# Patient Record
Sex: Female | Born: 1950 | Race: White | Hispanic: No | Smoking: Never smoker
Health system: Southern US, Community
[De-identification: ages and names within clinical notes are randomized; demographics above are authoritative.]

## PROBLEM LIST (undated history)

## (undated) DIAGNOSIS — E119 Type 2 diabetes mellitus without complications: Secondary | ICD-10-CM

## (undated) DIAGNOSIS — M797 Fibromyalgia: Secondary | ICD-10-CM

## (undated) DIAGNOSIS — E7800 Pure hypercholesterolemia, unspecified: Secondary | ICD-10-CM

## (undated) DIAGNOSIS — C801 Malignant (primary) neoplasm, unspecified: Secondary | ICD-10-CM

## (undated) DIAGNOSIS — K219 Gastro-esophageal reflux disease without esophagitis: Secondary | ICD-10-CM

## (undated) DIAGNOSIS — C20 Malignant neoplasm of rectum: Secondary | ICD-10-CM

## (undated) DIAGNOSIS — J45909 Unspecified asthma, uncomplicated: Secondary | ICD-10-CM

## (undated) DIAGNOSIS — M1990 Unspecified osteoarthritis, unspecified site: Secondary | ICD-10-CM

## (undated) DIAGNOSIS — I10 Essential (primary) hypertension: Secondary | ICD-10-CM

## (undated) HISTORY — PX: BREAST CYST EXCISION: SHX579

## (undated) HISTORY — PX: ABDOMINAL HYSTERECTOMY: SHX81

## (undated) HISTORY — PX: INSERTION OF MESH: SHX5868

---

## 2001-08-10 DIAGNOSIS — C20 Malignant neoplasm of rectum: Secondary | ICD-10-CM

## 2001-08-10 HISTORY — DX: Malignant neoplasm of rectum: C20

## 2011-08-11 HISTORY — PX: COLON RESECTION: SHX5231

## 2013-09-01 ENCOUNTER — Emergency Department (INDEPENDENT_AMBULATORY_CARE_PROVIDER_SITE_OTHER)
Admission: EM | Admit: 2013-09-01 | Discharge: 2013-09-01 | Disposition: A | Discharge status: Home / Self Care | Payer: Medicare Other | Source: Home / Self Care | Attending: Family Medicine | Admitting: Family Medicine

## 2013-09-01 ENCOUNTER — Encounter (HOSPITAL_COMMUNITY): Payer: Self-pay | Admitting: Emergency Medicine

## 2013-09-01 ENCOUNTER — Emergency Department (INDEPENDENT_AMBULATORY_CARE_PROVIDER_SITE_OTHER): Payer: Medicare Other

## 2013-09-01 DIAGNOSIS — J329 Chronic sinusitis, unspecified: Secondary | ICD-10-CM

## 2013-09-01 HISTORY — DX: Type 2 diabetes mellitus without complications: E11.9

## 2013-09-01 HISTORY — DX: Malignant (primary) neoplasm, unspecified: C80.1

## 2013-09-01 HISTORY — DX: Pure hypercholesterolemia, unspecified: E78.00

## 2013-09-01 HISTORY — DX: Unspecified asthma, uncomplicated: J45.909

## 2013-09-01 HISTORY — DX: Essential (primary) hypertension: I10

## 2013-09-01 LAB — POCT I-STAT, CHEM 8
BUN: 14 mg/dL (ref 6–23)
CREATININE: 0.6 mg/dL (ref 0.50–1.10)
Calcium, Ion: 1.28 mmol/L (ref 1.13–1.30)
Chloride: 103 mEq/L (ref 96–112)
Glucose, Bld: 124 mg/dL — ABNORMAL HIGH (ref 70–99)
HEMATOCRIT: 35 % — AB (ref 36.0–46.0)
HEMOGLOBIN: 11.9 g/dL — AB (ref 12.0–15.0)
POTASSIUM: 4 meq/L (ref 3.7–5.3)
Sodium: 141 mEq/L (ref 137–147)
TCO2: 27 mmol/L (ref 0–100)

## 2013-09-01 LAB — CBC WITH DIFFERENTIAL/PLATELET
BASOS ABS: 0 10*3/uL (ref 0.0–0.1)
BASOS PCT: 0 % (ref 0–1)
EOS ABS: 0.1 10*3/uL (ref 0.0–0.7)
EOS PCT: 2 % (ref 0–5)
HCT: 37.9 % (ref 36.0–46.0)
Hemoglobin: 12.7 g/dL (ref 12.0–15.0)
Lymphocytes Relative: 19 % (ref 12–46)
Lymphs Abs: 1.2 10*3/uL (ref 0.7–4.0)
MCH: 29.1 pg (ref 26.0–34.0)
MCHC: 33.5 g/dL (ref 30.0–36.0)
MCV: 86.9 fL (ref 78.0–100.0)
Monocytes Absolute: 0.7 10*3/uL (ref 0.1–1.0)
Monocytes Relative: 11 % (ref 3–12)
NEUTROS PCT: 68 % (ref 43–77)
Neutro Abs: 4.4 10*3/uL (ref 1.7–7.7)
PLATELETS: 237 10*3/uL (ref 150–400)
RBC: 4.36 MIL/uL (ref 3.87–5.11)
RDW: 13.7 % (ref 11.5–15.5)
WBC: 6.4 10*3/uL (ref 4.0–10.5)

## 2013-09-01 MED ORDER — METHYLPREDNISOLONE (PAK) 4 MG PO TABS: ORAL_TABLET | ORAL | Status: DC

## 2013-09-01 MED ORDER — MINOCYCLINE HCL 100 MG PO CAPS: 100.0000 mg | ORAL_CAPSULE | Freq: Two times a day (BID) | ORAL | Status: DC

## 2013-09-01 MED ORDER — FLUTICASONE PROPIONATE 50 MCG/ACT NA SUSP: 2.0000 | Freq: Two times a day (BID) | NASAL | Status: DC

## 2013-09-01 NOTE — ED Notes (Signed)
C/o  Sinus pressure and pain.  Post nasal drip.  Productive cough with yellow sputum.  Chest congestion.  States felt feverish at night.  Pt has not tried any otc meds for symptoms.   Symptoms present x 4 days.  Denies n/v/d

## 2013-09-01 NOTE — ED Provider Notes (Signed)
Medical screening examination/treatment/procedure(s) were performed by resident physician or non-physician practitioner and as supervising physician I was immediately available for consultation/collaboration.   Marzell Isakson DOUGLAS MD.   Alger Kerstein D Edye Hainline, MD 09/01/13 1313 

## 2013-09-01 NOTE — ED Notes (Signed)
Waiting on EKG before x-ray

## 2013-09-01 NOTE — ED Provider Notes (Signed)
CSN: [id number]     Arrival date & time 09/01/13  4098 History   First MD Initiated Contact with Patient 09/01/13 1010     Chief Complaint  Patient presents with  . URI   (Consider location/radiation/quality/duration/timing/severity/associated sxs/prior Treatment) HPI Comments: 63 year old female presents complaining of sinus pressure and pain, postnasal drip, productive cough, chest congestion, subjective fevers, hearing loss, loss of smell, wheezing, body aches. This has all been present for the past 4 days, getting neither better nor worse. She has not tried taking any over-the-counter medications. She has multiple sick contacts. She denies any other symptoms. She also admits to a very short episode of chest pain a couple days ago, this resolved quickly without any intervention and did not have any associated symptoms.  Patient is a 63 y.o. female presenting with URI.  URI Presenting symptoms: congestion, cough, fatigue, fever, rhinorrhea and sore throat   Associated symptoms: arthralgias, headaches, myalgias and wheezing (Hx asthma )     Past Medical History  Diagnosis Date  . Asthma   . Hypertension   . Diabetes mellitus without complication   . Elevated cholesterol   . Cancer    History reviewed. No pertinent past surgical history. No family history on file. History  Substance Use Topics  . Smoking status: Never Smoker   . Smokeless tobacco: Not on file  . Alcohol Use: No   OB History   Grav Para Term Preterm Abortions TAB SAB Ect Mult Living                 Review of Systems  Constitutional: Positive for fever, chills and fatigue.  HENT: Positive for congestion, hearing loss, rhinorrhea, sinus pressure and sore throat.        Decreased sense of smell  Eyes: Negative for visual disturbance.  Respiratory: Positive for cough, chest tightness, shortness of breath (mild, due to congestion) and wheezing (Hx asthma ).   Cardiovascular: Positive for chest pain. Negative for  palpitations and leg swelling.  Gastrointestinal: Negative for nausea, vomiting, abdominal pain and diarrhea.  Endocrine: Negative for polydipsia and polyuria.  Genitourinary: Negative for dysuria, urgency and frequency.  Musculoskeletal: Positive for arthralgias and myalgias.       Hx fibromyalgia   Skin: Negative for rash.  Neurological: Positive for headaches. Negative for dizziness, weakness and light-headedness.    Allergies  Review of patient's allergies indicates no known allergies.  Home Medications   Current Outpatient Rx  Name  Route  Sig  Dispense  Refill  . lisinopril-hydrochlorothiazide (PRINZIDE,ZESTORETIC) 20-25 MG per tablet   Oral   Take 1 tablet by mouth daily.         . pravastatin (PRAVACHOL) 20 MG tablet   Oral   Take 20 mg by mouth daily.         . Ranitidine HCl (ZANTAC PO)   Oral   Take by mouth.         . fluticasone (FLONASE) 50 MCG/ACT nasal spray   Each Nare   Place 2 sprays into both nostrils 2 (two) times daily.   1 g   2   . methylPREDNIsolone (MEDROL DOSPACK) 4 MG tablet      follow package directions   21 tablet   0   . minocycline (MINOCIN) 100 MG capsule   Oral   Take 1 capsule (100 mg total) by mouth 2 (two) times daily.   20 capsule   0    BP 136/83  Pulse 85  Temp(Src)  98.5 F (36.9 C) (Oral)  Resp 18  SpO2 98% Physical Exam  Nursing note and vitals reviewed. Constitutional: She is oriented to person, place, and time. Vital signs are normal. She appears well-developed and well-nourished. No distress.  HENT:  Head: Normocephalic and atraumatic.  Right Ear: There is drainage (Cerumen impaction).  Left Ear: Tympanic membrane, external ear and ear canal normal.  Nose: Nose normal. Right sinus exhibits no maxillary sinus tenderness and no frontal sinus tenderness. Left sinus exhibits no maxillary sinus tenderness and no frontal sinus tenderness.  Mouth/Throat: Oropharynx is clear and moist. No oropharyngeal exudate.   Eyes: Conjunctivae are normal. Right eye exhibits no discharge. Left eye exhibits no discharge.  Neck: Normal range of motion.  Cardiovascular: Normal rate, regular rhythm and normal heart sounds.  Exam reveals no gallop and no friction rub.   No murmur heard. Pulmonary/Chest: Effort normal and breath sounds normal. No respiratory distress. She has no wheezes. She has no rales.  Lymphadenopathy:    She has no cervical adenopathy.  Neurological: She is alert and oriented to person, place, and time. She has normal strength. Coordination normal.  Skin: Skin is warm and dry. No rash noted. She is not diaphoretic.  Psychiatric: She has a normal mood and affect. Judgment normal.    ED Course  Procedures (including critical care time) Labs Review Labs Reviewed  POCT I-STAT, CHEM 8 - Abnormal; Notable for the following:    Glucose, Bld 124 (*)    Hemoglobin 11.9 (*)    HCT 35.0 (*)    All other components within normal limits  CBC WITH DIFFERENTIAL   Imaging Review Dg Chest 2 View  09/01/2013   CLINICAL DATA:  Upper respiratory infection. A congestion and pain in chest.  EXAM: CHEST  2 VIEW  COMPARISON:  None.  FINDINGS: Cardiac silhouette is upper limits of normal in size. Aorta is mildly tortuous. The lungs are well inflated without evidence of focal airspace consolidation, edema, pleural effusion, or pneumothorax. No acute osseous abnormality is seen.  IMPRESSION: No evidence of acute airspace disease.   Electronically Signed   By: Logan Bores   On: 09/01/2013 11:20     EKG is normal. I-STAT and CBC are both normal as well. Chest x-ray is also normal  MDM   1. Sinusitis    Followup if not improving. May take over-the-counter medications as needed addition to the prescriptions  Meds ordered this encounter  Medications  . minocycline (MINOCIN) 100 MG capsule    Sig: Take 1 capsule (100 mg total) by mouth 2 (two) times daily.    Dispense:  20 capsule    Refill:  0    Order  Specific Question:  Supervising Provider    Answer:  Billy Fischer [phone]  . methylPREDNIsolone (MEDROL DOSPACK) 4 MG tablet    Sig: follow package directions    Dispense:  21 tablet    Refill:  0    Order Specific Question:  Supervising Provider    Answer:  Billy Fischer [phone]  . fluticasone (FLONASE) 50 MCG/ACT nasal spray    Sig: Place 2 sprays into both nostrils 2 (two) times daily.    Dispense:  1 g    Refill:  2    Order Specific Question:  Supervising Provider    Answer:  Ihor Gully D [place] Ayo Guarino, PA-C 09/01/13 1141

## 2013-09-01 NOTE — Discharge Instructions (Signed)

## 2013-12-23 ENCOUNTER — Encounter (HOSPITAL_COMMUNITY): Payer: Self-pay | Admitting: *Deleted

## 2013-12-23 ENCOUNTER — Inpatient Hospital Stay (HOSPITAL_COMMUNITY)
Admission: AD | Admit: 2013-12-23 | Discharge: 2013-12-24 | Disposition: A | Discharge status: Home / Self Care | Payer: Medicare Other | Source: Ambulatory Visit | Attending: Obstetrics & Gynecology | Admitting: Obstetrics & Gynecology

## 2013-12-23 DIAGNOSIS — N644 Mastodynia: Secondary | ICD-10-CM | POA: Insufficient documentation

## 2013-12-23 DIAGNOSIS — E119 Type 2 diabetes mellitus without complications: Secondary | ICD-10-CM | POA: Insufficient documentation

## 2013-12-23 DIAGNOSIS — I10 Essential (primary) hypertension: Secondary | ICD-10-CM | POA: Insufficient documentation

## 2013-12-23 DIAGNOSIS — N63 Unspecified lump in unspecified breast: Secondary | ICD-10-CM | POA: Insufficient documentation

## 2013-12-23 DIAGNOSIS — N6310 Unspecified lump in the right breast, unspecified quadrant: Secondary | ICD-10-CM

## 2013-12-23 DIAGNOSIS — Z85048 Personal history of other malignant neoplasm of rectum, rectosigmoid junction, and anus: Secondary | ICD-10-CM | POA: Insufficient documentation

## 2013-12-23 HISTORY — DX: Malignant neoplasm of rectum: C20

## 2013-12-23 NOTE — MAU Note (Signed)
Patient reports soreness in the right breast around the nipple that started yesterday. Took Ibuprofen today because the pain was so bad. States she felt a hard area under the nipple today and got concerned. Last mammogram was in Sept- wnl. No hx of breast cancer.

## 2013-12-24 DIAGNOSIS — N644 Mastodynia: Secondary | ICD-10-CM

## 2013-12-24 NOTE — MAU Provider Note (Signed)
Chief Complaint: Breast Pain   First Provider Initiated Contact with Patient 12/24/13 0013     SUBJECTIVE HPI: [name] is a 63 y.o. G5P5000 who presents to maternity admissions reporting pain and lump in her right breast, underneath her nipple.  She reports she started to have pain yesterday and it has gotten worse today.  She had a normal screening mammogram 8 months ago.  She recently moved to [place] and established care with a PCP but developed this problem after her visit.  She has hx significant for rectal cancer so is worried about breast cancer today. She denies abdominal pain, vaginal bleeding, vaginal itching/burning, urinary symptoms, h/a, dizziness, n/v, or fever/chills.     Past Medical History  Diagnosis Date  . Asthma   . Hypertension   . Diabetes mellitus without complication   . Elevated cholesterol   . Cancer   . Rectal cancer Oct 2013   Past Surgical History  Procedure Laterality Date  . Colon resection  2013  . Abdominal hysterectomy    . Breast cyst excision    . Insertion of mesh      Abdominal mesh placed after chemotherapy   History   Social History  . Marital Status: Widowed    Spouse Name: N/A    Number of Children: N/A  . Years of Education: N/A   Occupational History  . Not on file.   Social History Main Topics  . Smoking status: Never Smoker   . Smokeless tobacco: Not on file  . Alcohol Use: No  . Drug Use: No  . Sexual Activity: Not Currently   Other Topics Concern  . Not on file   Social History Narrative  . No narrative on file   No current facility-administered medications on file prior to encounter.   Current Outpatient Prescriptions on File Prior to Encounter  Medication Sig Dispense Refill  . lisinopril-hydrochlorothiazide (PRINZIDE,ZESTORETIC) 20-25 MG per tablet Take 1 tablet by mouth daily.      . pravastatin (PRAVACHOL) 20 MG tablet Take 20 mg by mouth daily.      . fluticasone (FLONASE) 50 MCG/ACT nasal spray Place  2 sprays into both nostrils 2 (two) times daily.  1 g  2  . methylPREDNIsolone (MEDROL DOSPACK) 4 MG tablet follow package directions  21 tablet  0  . minocycline (MINOCIN) 100 MG capsule Take 1 capsule (100 mg total) by mouth 2 (two) times daily.  20 capsule  0  . Ranitidine HCl (ZANTAC PO) Take by mouth.       No Known Allergies  ROS: Pertinent items in HPI  OBJECTIVE Blood pressure 142/82, pulse 85, temperature 98.4 F (36.9 C), temperature source Oral, resp. rate 20, height 5' 3.5" (1.613 m), weight 92.534 kg (204 lb), SpO2 97.00%. GENERAL: Well-developed, well-nourished female in no acute distress.  HEENT: Normocephalic HEART: normal rate RESP: normal effort Physical Examination: Breasts - left breast normal without mass, skin or nipple changes or axillary nodes, abnormal mass palpable directly under right nipple, 3-4 cm in size, soft, mobile, smooth, mild erythema surrounding right nipple extending past areola ABDOMEN: Soft, non-tender EXTREMITIES: Nontender, no edema NEURO: Alert and oriented   ASSESSMENT 1. Breast pain, right   2. Breast mass, right     PLAN Discharge home Breast U/S ordered at [facility] Reassurance provided that exam consistent with cyst at this time  Return to MAU as needed for emergencies    Medication List         fluticasone  50 MCG/ACT nasal spray  Commonly known as:  FLONASE  Place 2 sprays into both nostrils 2 (two) times daily.     ibuprofen 800 MG tablet  Commonly known as:  ADVIL,MOTRIN  Take 800 mg by mouth every 8 (eight) hours as needed.     lisinopril-hydrochlorothiazide 20-25 MG per tablet  Commonly known as:  PRINZIDE,ZESTORETIC  Take 1 tablet by mouth daily.     methylPREDNIsolone 4 MG tablet  Commonly known as:  MEDROL DOSPACK  follow package directions     minocycline 100 MG capsule  Commonly known as:  MINOCIN  Take 1 capsule (100 mg total) by mouth 2 (two) times daily.     pravastatin 20 MG tablet  Commonly  known as:  PRAVACHOL  Take 20 mg by mouth daily.     ZANTAC PO  Take by mouth.           Follow-up Information   Follow up with [facility]. ([facility] will call you or call the number listed below. )    Specialty:  Diagnostic Radiology   Contact information:   [place] [address] [place] Certified Nurse-Midwife 12/24/2013  12:19 AM

## 2013-12-24 NOTE — Discharge Instructions (Signed)
Breast Cyst  A breast cyst is a sac in the breast that is filled with fluid. Breast cysts are common in women. Women can have one or many cysts. When the breasts contain many cysts, it is usually due to a noncancerous (benign) condition called fibrocystic change. These lumps form under the influence of female hormones (estrogen and progesterone). The lumps are most often located in the upper, outer portion of the breast. They are often more swollen, painful, and tender before your period starts. They usually disappear after menopause, unless you are on hormone therapy.   There are several types of cysts:  · Macrocyst. This is a cyst that is about 2 in. (5.1 cm) in diameter.    · Microcyst. This is a tiny cyst that you cannot feel but can be seen with a mammogram or an ultrasound.    · Galactocele. This is a cyst containing milk that may develop if you suddenly stop breastfeeding.    · Sebaceous cyst of the skin. This type of cyst is not in the breast tissue itself.  Breast cysts do not increase your risk of breast cancer. However, they must be monitored closely because they can be cancerous.   CAUSES   It is not known exactly what causes a breast cyst to form. Possible causes include:   · An overgrowth of milk glands and connective tissue in the breast can block the milk glands, causing them to fill with fluid.    · Scar tissue in the breast from previous surgery may block the glands, causing a cyst.    RISK FACTORS  Estrogen may influence the development of a breast cyst.    SIGNS AND SYMPTOMS   · Feeling a smooth, round, soft lump (like a grape) in the breast that is easily moveable.    · Breast discomfort or pain.  · Increase in size of the lump before your menstrual period and decrease in its size after your menstrual period.    DIAGNOSIS   A cyst can be felt during a physical exam by your health care provider. A breast X-ray exam (mammogram) and ultrasonography will be done to confirm the diagnosis. Fluid may  be removed from the cyst with a needle (fine needle aspiration) to make sure the cyst is not cancerous.    TREATMENT   Treatment may not be necessary. Your health care provider may monitor the cyst to see if it goes away on its own. If treatment is needed, it may include:  · Hormone treatment.    · Needle aspiration. There is a chance of the cyst coming back after aspiration.    · Surgery to remove the whole cyst.    HOME CARE INSTRUCTIONS   · Keep all follow-up appointments with your health care provider.  · See your health care provider regularly:  · Get a yearly exam by your health care provider.  · Have a clinical breast exam by a health care provider every 1 3 years if you are 20 61 years of age. After age 40 years, you should have the exam every year.    · Get mammogram tests as directed by your health care provider.    · Understand the normal appearance and feel of your breasts and perform breast self-exams.    · Only take over-the-counter or prescription medicines as directed by your health care provider.    · Wear a supportive bra, especially when exercising.    · Avoid caffeine.    · Reduce your salt intake, especially before your menstrual period. Too much salt can cause fluid retention, breast   swelling, and discomfort.    SEEK MEDICAL CARE IF:   · You feel, or think you feel, a lump in your breast.    · You notice that both breasts look or feel different than usual.    · Your breast is still causing pain after your menstrual period is over.    · You need medicine for breast pain and swelling that occurs with your menstrual period.    SEEK IMMEDIATE MEDICAL CARE IF:   · You have severe pain, tenderness, redness, or warmth in your breast.    · You have nipple discharge or bleeding.    · Your breast lump becomes hard and painful.    · You find new lumps or bumps that were not there before.    · You feel lumps in your armpit (axilla).    · You notice dimpling or wrinkling of the breast or nipple.    · You  have a fever.    MAKE SURE YOU:  · Understand these instructions.  · Will watch your condition.  · Will get help right away if you are not doing well or get worse.  Document Released: 07/27/2005 Document Revised: 03/29/2013 Document Reviewed: 02/23/2013  [employer]® Patient Information ©2014 [employer].

## 2013-12-25 ENCOUNTER — Other Ambulatory Visit: Payer: Self-pay

## 2013-12-25 ENCOUNTER — Other Ambulatory Visit: Payer: Self-pay | Admitting: Advanced Practice Midwife

## 2013-12-25 ENCOUNTER — Other Ambulatory Visit: Payer: Self-pay | Admitting: Obstetrics & Gynecology

## 2013-12-25 ENCOUNTER — Other Ambulatory Visit (HOSPITAL_COMMUNITY): Payer: Self-pay | Admitting: Advanced Practice Midwife

## 2013-12-25 DIAGNOSIS — N644 Mastodynia: Secondary | ICD-10-CM

## 2013-12-25 DIAGNOSIS — N6310 Unspecified lump in the right breast, unspecified quadrant: Secondary | ICD-10-CM

## 2014-01-04 ENCOUNTER — Other Ambulatory Visit: Payer: Medicare Other

## 2014-01-08 ENCOUNTER — Other Ambulatory Visit (HOSPITAL_COMMUNITY): Payer: Self-pay | Admitting: Orthopaedic Surgery

## 2014-01-26 ENCOUNTER — Encounter (HOSPITAL_COMMUNITY): Payer: Self-pay

## 2014-01-26 NOTE — Pre-Procedure Instructions (Signed)
[name]  01/26/2014   Your procedure is scheduled on:  Friday, July 3rd  Report to [facility] Admitting at 0530 AM.  Call this number if you have problems the morning of surgery: [phone]   Remember:   Do not eat food or drink liquids after midnight.   Take these medicines the morning of surgery with A SIP OF WATER: zantac, vicodin if needed   Do not wear jewelry, make-up or nail polish.  Do not wear lotions, powders, or perfumes. You may wear deodorant.  Do not shave 48 hours prior to surgery. Men may shave face and neck.  Do not bring valuables to the hospital.  [facility] is not responsible  for any belongings or valuables.               Contacts, dentures or bridgework may not be worn into surgery.  Leave suitcase in the car. After surgery it may be brought to your room.  For patients admitted to the hospital, discharge time is determined by your treatment team.             Please read over the following fact sheets that you were given: Pain Booklet, Coughing and Deep Breathing, Blood Transfusion Information, MRSA Information and Surgical Site Infection Prevention [facility] - Preparing for Surgery  Before surgery, you can play an important role.  Because skin is not sterile, your skin needs to be as free of germs as possible.  You can reduce the number of germs on you skin by washing with CHG (chlorahexidine gluconate) soap before surgery.  CHG is an antiseptic cleaner which kills germs and bonds with the skin to continue killing germs even after washing.  Please DO NOT use if you have an allergy to CHG or antibacterial soaps.  If your skin becomes reddened/irritated stop using the CHG and inform your nurse when you arrive at Short Stay.  Do not shave (including legs and underarms) for at least 48 hours prior to the first CHG shower.  You may shave your face.  Please follow these instructions carefully:   1.  Shower with CHG Soap the night before surgery and  the morning of Surgery.  2.  If you choose to wash your hair, wash your hair first as usual with your normal shampoo.  3.  After you shampoo, rinse your hair and body thoroughly to remove the shampoo.  4.  Use CHG as you would any other liquid soap.  You can apply CHG directly to the skin and wash gently with scrungie or a clean washcloth.  5.  Apply the CHG Soap to your body ONLY FROM THE NECK DOWN.  Do not use on open wounds or open sores.  Avoid contact with your eyes, ears, mouth and genitals (private parts).  Wash genitals (private parts) with your normal soap.  6.  Wash thoroughly, paying special attention to the area where your surgery will be performed.  7.  Thoroughly rinse your body with warm water from the neck down.  8.  DO NOT shower/wash with your normal soap after using and rinsing off the CHG Soap.  9.  Pat yourself dry with a clean towel.            10.  Wear clean pajamas.            11.  Place clean sheets on your bed the night of your first shower and do not sleep with pets.  Day of Surgery  Do not apply any lotions/deoderants the morning of surgery.  Please wear clean clothes to the hospital/surgery center.

## 2014-01-29 ENCOUNTER — Encounter (HOSPITAL_COMMUNITY): Payer: Self-pay

## 2014-01-29 ENCOUNTER — Encounter (HOSPITAL_COMMUNITY)
Admission: RE | Admit: 2014-01-29 | Discharge: 2014-01-29 | Disposition: A | Discharge status: Home / Self Care | Payer: Medicare Other | Source: Ambulatory Visit | Attending: Orthopaedic Surgery | Admitting: Orthopaedic Surgery

## 2014-01-29 ENCOUNTER — Other Ambulatory Visit (HOSPITAL_COMMUNITY): Payer: Medicare Other

## 2014-01-29 DIAGNOSIS — Z01812 Encounter for preprocedural laboratory examination: Secondary | ICD-10-CM | POA: Insufficient documentation

## 2014-01-29 HISTORY — DX: Fibromyalgia: M79.7

## 2014-01-29 HISTORY — DX: Unspecified osteoarthritis, unspecified site: M19.90

## 2014-01-29 HISTORY — DX: Gastro-esophageal reflux disease without esophagitis: K21.9

## 2014-01-29 LAB — BASIC METABOLIC PANEL
BUN: 12 mg/dL (ref 6–23)
CALCIUM: 9.6 mg/dL (ref 8.4–10.5)
CO2: 28 mEq/L (ref 19–32)
CREATININE: 0.66 mg/dL (ref 0.50–1.10)
Chloride: 103 mEq/L (ref 96–112)
Glucose, Bld: 119 mg/dL — ABNORMAL HIGH (ref 70–99)
Potassium: 4.1 mEq/L (ref 3.7–5.3)
Sodium: 140 mEq/L (ref 137–147)

## 2014-01-29 LAB — SURGICAL PCR SCREEN
MRSA, PCR: NEGATIVE
STAPHYLOCOCCUS AUREUS: NEGATIVE

## 2014-01-29 LAB — CBC
HCT: 38.8 % (ref 36.0–46.0)
Hemoglobin: 12.4 g/dL (ref 12.0–15.0)
MCH: 28 pg (ref 26.0–34.0)
MCHC: 32 g/dL (ref 30.0–36.0)
MCV: 87.6 fL (ref 78.0–100.0)
PLATELETS: 250 10*3/uL (ref 150–400)
RBC: 4.43 MIL/uL (ref 3.87–5.11)
RDW: 13.9 % (ref 11.5–15.5)
WBC: 5.4 10*3/uL (ref 4.0–10.5)

## 2014-01-29 LAB — URINALYSIS, ROUTINE W REFLEX MICROSCOPIC
Bilirubin Urine: NEGATIVE
Glucose, UA: NEGATIVE mg/dL
Hgb urine dipstick: NEGATIVE
Ketones, ur: NEGATIVE mg/dL
NITRITE: NEGATIVE
PROTEIN: NEGATIVE mg/dL
Specific Gravity, Urine: 1.019 (ref 1.005–1.030)
Urobilinogen, UA: 0.2 mg/dL (ref 0.0–1.0)
pH: 6 (ref 5.0–8.0)

## 2014-01-29 LAB — TYPE AND SCREEN
ABO/RH(D): O POS
ANTIBODY SCREEN: NEGATIVE

## 2014-01-29 LAB — URINE MICROSCOPIC-ADD ON

## 2014-01-29 LAB — ABO/RH: ABO/RH(D): O POS

## 2014-01-29 LAB — PROTIME-INR
INR: 0.93 (ref 0.00–1.49)
PROTHROMBIN TIME: 12.3 s (ref 11.6–15.2)

## 2014-01-29 LAB — APTT: aPTT: 30 seconds (ref 24–37)

## 2014-01-29 NOTE — Progress Notes (Signed)
01/29/14 0829  OBSTRUCTIVE SLEEP APNEA  Have you ever been diagnosed with sleep apnea through a sleep study? No  Do you snore loudly (loud enough to be heard through closed doors)?  1  Do you often feel tired, fatigued, or sleepy during the daytime? 1  Has anyone observed you stop breathing during your sleep? 0  Do you have, or are you being treated for high blood pressure? 1  BMI more than 35 kg/m2? 1  Age over 61 years old? 1  Neck circumference greater than 40 cm/16 inches? 0  Gender: 0  Obstructive Sleep Apnea Score 5  Score 4 or greater  Results sent to PCP

## 2014-02-08 MED ORDER — CEFAZOLIN SODIUM-DEXTROSE 2-3 GM-% IV SOLR
2.0000 g | INTRAVENOUS | Status: AC
  Administered 2014-02-09: 2 g via INTRAVENOUS
  Filled 2014-02-08: qty 50

## 2014-02-09 ENCOUNTER — Inpatient Hospital Stay (HOSPITAL_COMMUNITY): Payer: Medicare Other

## 2014-02-09 ENCOUNTER — Encounter (HOSPITAL_COMMUNITY): Payer: Self-pay | Admitting: Anesthesiology

## 2014-02-09 ENCOUNTER — Inpatient Hospital Stay (HOSPITAL_COMMUNITY)
Admission: RE | Admit: 2014-02-09 | Discharge: 2014-02-13 | DRG: 470 | Disposition: A | Discharge status: Skilled Nursing Facility | Payer: Medicare Other | Source: Ambulatory Visit | Attending: Orthopaedic Surgery | Admitting: Orthopaedic Surgery

## 2014-02-09 ENCOUNTER — Inpatient Hospital Stay (HOSPITAL_COMMUNITY): Payer: Medicare Other | Admitting: Anesthesiology

## 2014-02-09 ENCOUNTER — Encounter (HOSPITAL_COMMUNITY)
Admission: RE | Disposition: A | Discharge status: Skilled Nursing Facility | Payer: Self-pay | Source: Ambulatory Visit | Attending: Orthopaedic Surgery

## 2014-02-09 ENCOUNTER — Encounter (HOSPITAL_COMMUNITY): Payer: Medicare Other | Admitting: Anesthesiology

## 2014-02-09 DIAGNOSIS — I82402 Acute embolism and thrombosis of unspecified deep veins of left lower extremity: Secondary | ICD-10-CM

## 2014-02-09 DIAGNOSIS — Y9210 Unspecified residential institution as the place of occurrence of the external cause: Secondary | ICD-10-CM | POA: Diagnosis not present

## 2014-02-09 DIAGNOSIS — Z96652 Presence of left artificial knee joint: Secondary | ICD-10-CM

## 2014-02-09 DIAGNOSIS — Z85048 Personal history of other malignant neoplasm of rectum, rectosigmoid junction, and anus: Secondary | ICD-10-CM

## 2014-02-09 DIAGNOSIS — I10 Essential (primary) hypertension: Secondary | ICD-10-CM | POA: Diagnosis present

## 2014-02-09 DIAGNOSIS — Z79899 Other long term (current) drug therapy: Secondary | ICD-10-CM

## 2014-02-09 DIAGNOSIS — M1712 Unilateral primary osteoarthritis, left knee: Secondary | ICD-10-CM | POA: Diagnosis present

## 2014-02-09 DIAGNOSIS — I824Z9 Acute embolism and thrombosis of unspecified deep veins of unspecified distal lower extremity: Secondary | ICD-10-CM | POA: Diagnosis not present

## 2014-02-09 DIAGNOSIS — Y831 Surgical operation with implant of artificial internal device as the cause of abnormal reaction of the patient, or of later complication, without mention of misadventure at the time of the procedure: Secondary | ICD-10-CM | POA: Diagnosis not present

## 2014-02-09 DIAGNOSIS — K219 Gastro-esophageal reflux disease without esophagitis: Secondary | ICD-10-CM | POA: Diagnosis present

## 2014-02-09 DIAGNOSIS — Z96659 Presence of unspecified artificial knee joint: Secondary | ICD-10-CM

## 2014-02-09 DIAGNOSIS — M1710 Unilateral primary osteoarthritis, unspecified knee: Principal | ICD-10-CM | POA: Diagnosis present

## 2014-02-09 DIAGNOSIS — E785 Hyperlipidemia, unspecified: Secondary | ICD-10-CM | POA: Diagnosis present

## 2014-02-09 DIAGNOSIS — I82409 Acute embolism and thrombosis of unspecified deep veins of unspecified lower extremity: Secondary | ICD-10-CM | POA: Diagnosis not present

## 2014-02-09 DIAGNOSIS — IMO0001 Reserved for inherently not codable concepts without codable children: Secondary | ICD-10-CM | POA: Diagnosis present

## 2014-02-09 DIAGNOSIS — I999 Unspecified disorder of circulatory system: Secondary | ICD-10-CM | POA: Diagnosis not present

## 2014-02-09 DIAGNOSIS — E119 Type 2 diabetes mellitus without complications: Secondary | ICD-10-CM | POA: Diagnosis present

## 2014-02-09 HISTORY — PX: TOTAL KNEE ARTHROPLASTY: SHX125

## 2014-02-09 LAB — GLUCOSE, CAPILLARY
GLUCOSE-CAPILLARY: 154 mg/dL — AB (ref 70–99)
GLUCOSE-CAPILLARY: 168 mg/dL — AB (ref 70–99)
GLUCOSE-CAPILLARY: 151 mg/dL — AB (ref 70–99)
Glucose-Capillary: 164 mg/dL — ABNORMAL HIGH (ref 70–99)

## 2014-02-09 SURGERY — ARTHROPLASTY, KNEE, TOTAL
Anesthesia: Regional | Site: Knee | Laterality: Left

## 2014-02-09 MED ORDER — NEOSTIGMINE METHYLSULFATE 10 MG/10ML IV SOLN
INTRAVENOUS | Status: AC
  Filled 2014-02-09: qty 3

## 2014-02-09 MED ORDER — ARTIFICIAL TEARS OP OINT
TOPICAL_OINTMENT | OPHTHALMIC | Status: AC
  Filled 2014-02-09: qty 3.5

## 2014-02-09 MED ORDER — SODIUM CHLORIDE 0.9 % IR SOLN
Status: DC | PRN
  Administered 2014-02-09: 3000 mL

## 2014-02-09 MED ORDER — METOCLOPRAMIDE HCL 5 MG/ML IJ SOLN: 5.0000 mg | Freq: Three times a day (TID) | INTRAMUSCULAR | Status: DC | PRN

## 2014-02-09 MED ORDER — PROPOFOL 10 MG/ML IV BOLUS
INTRAVENOUS | Status: DC | PRN
  Administered 2014-02-09: 150 mg via INTRAVENOUS

## 2014-02-09 MED ORDER — DIPHENHYDRAMINE HCL 12.5 MG/5ML PO ELIX
12.5000 mg | ORAL_SOLUTION | ORAL | Status: DC | PRN
Start: 2014-02-09 — End: 2014-02-13

## 2014-02-09 MED ORDER — ONDANSETRON HCL 4 MG PO TABS: 4.0000 mg | ORAL_TABLET | Freq: Four times a day (QID) | ORAL | Status: DC | PRN

## 2014-02-09 MED ORDER — ZOLPIDEM TARTRATE 5 MG PO TABS
5.0000 mg | ORAL_TABLET | Freq: Every evening | ORAL | Status: DC | PRN
  Administered 2014-02-10: 5 mg via ORAL
  Filled 2014-02-09: qty 1

## 2014-02-09 MED ORDER — FENTANYL CITRATE 0.05 MG/ML IJ SOLN
INTRAMUSCULAR | Status: AC
  Filled 2014-02-09: qty 5

## 2014-02-09 MED ORDER — GLYCOPYRROLATE 0.2 MG/ML IJ SOLN
INTRAMUSCULAR | Status: DC | PRN
  Administered 2014-02-09: 0.6 mg via INTRAVENOUS

## 2014-02-09 MED ORDER — METHOCARBAMOL 500 MG PO TABS
ORAL_TABLET | ORAL | Status: AC
  Filled 2014-02-09: qty 1

## 2014-02-09 MED ORDER — POLYETHYLENE GLYCOL 3350 17 G PO PACK: 17.0000 g | PACK | Freq: Every day | ORAL | Status: DC | PRN

## 2014-02-09 MED ORDER — LIDOCAINE HCL (CARDIAC) 20 MG/ML IV SOLN
INTRAVENOUS | Status: AC
  Filled 2014-02-09: qty 10

## 2014-02-09 MED ORDER — LACTATED RINGERS IV SOLN
INTRAVENOUS | Status: DC | PRN
  Administered 2014-02-09 (×3): via INTRAVENOUS

## 2014-02-09 MED ORDER — OXYCODONE HCL 5 MG PO TABS
5.0000 mg | ORAL_TABLET | ORAL | Status: DC | PRN
  Administered 2014-02-09 – 2014-02-10 (×5): 10 mg via ORAL
  Filled 2014-02-09 (×4): qty 2

## 2014-02-09 MED ORDER — INSULIN ASPART 100 UNIT/ML ~~LOC~~ SOLN
0.0000 [IU] | Freq: Three times a day (TID) | SUBCUTANEOUS | Status: DC
  Administered 2014-02-09 (×2): 3 [IU] via SUBCUTANEOUS
  Administered 2014-02-10 – 2014-02-13 (×2): 2 [IU] via SUBCUTANEOUS

## 2014-02-09 MED ORDER — METHOCARBAMOL 500 MG PO TABS
500.0000 mg | ORAL_TABLET | Freq: Four times a day (QID) | ORAL | Status: DC | PRN
Start: 2014-02-09 — End: 2014-02-13
  Administered 2014-02-09 – 2014-02-12 (×8): 500 mg via ORAL
  Filled 2014-02-09 (×7): qty 1

## 2014-02-09 MED ORDER — ROCURONIUM BROMIDE 100 MG/10ML IV SOLN
INTRAVENOUS | Status: DC | PRN
  Administered 2014-02-09: 50 mg via INTRAVENOUS

## 2014-02-09 MED ORDER — LISINOPRIL-HYDROCHLOROTHIAZIDE 20-25 MG PO TABS: 1.0000 | ORAL_TABLET | Freq: Every day | ORAL | Status: DC

## 2014-02-09 MED ORDER — ONDANSETRON HCL 4 MG/2ML IJ SOLN
INTRAMUSCULAR | Status: DC | PRN
  Administered 2014-02-09: 4 mg via INTRAVENOUS

## 2014-02-09 MED ORDER — HYDROMORPHONE HCL PF 1 MG/ML IJ SOLN
INTRAMUSCULAR | Status: AC
  Filled 2014-02-09: qty 1

## 2014-02-09 MED ORDER — ONDANSETRON HCL 4 MG/2ML IJ SOLN
INTRAMUSCULAR | Status: AC
  Filled 2014-02-09: qty 2

## 2014-02-09 MED ORDER — DEXAMETHASONE SODIUM PHOSPHATE 4 MG/ML IJ SOLN
INTRAMUSCULAR | Status: AC
  Filled 2014-02-09: qty 2

## 2014-02-09 MED ORDER — NEOSTIGMINE METHYLSULFATE 10 MG/10ML IV SOLN
INTRAVENOUS | Status: DC | PRN
  Administered 2014-02-09: 5 mg via INTRAVENOUS

## 2014-02-09 MED ORDER — ONDANSETRON HCL 4 MG/2ML IJ SOLN: 4.0000 mg | Freq: Four times a day (QID) | INTRAMUSCULAR | Status: DC | PRN

## 2014-02-09 MED ORDER — OXYCODONE HCL 5 MG PO TABS
ORAL_TABLET | ORAL | Status: AC
  Filled 2014-02-09: qty 3

## 2014-02-09 MED ORDER — INSULIN ASPART 100 UNIT/ML ~~LOC~~ SOLN: 0.0000 [IU] | Freq: Every day | SUBCUTANEOUS | Status: DC

## 2014-02-09 MED ORDER — HYDROMORPHONE HCL PF 1 MG/ML IJ SOLN
1.0000 mg | INTRAMUSCULAR | Status: DC | PRN
  Administered 2014-02-10 (×4): 1 mg via INTRAVENOUS
  Filled 2014-02-09 (×5): qty 1

## 2014-02-09 MED ORDER — PROPOFOL 10 MG/ML IV BOLUS
INTRAVENOUS | Status: AC
  Filled 2014-02-09: qty 20

## 2014-02-09 MED ORDER — ACETAMINOPHEN 325 MG PO TABS
650.0000 mg | ORAL_TABLET | Freq: Four times a day (QID) | ORAL | Status: DC | PRN
Start: 2014-02-09 — End: 2014-02-13

## 2014-02-09 MED ORDER — GLYCOPYRROLATE 0.2 MG/ML IJ SOLN
INTRAMUSCULAR | Status: AC
  Filled 2014-02-09: qty 3

## 2014-02-09 MED ORDER — CEFAZOLIN SODIUM 1-5 GM-% IV SOLN
1.0000 g | Freq: Four times a day (QID) | INTRAVENOUS | Status: AC
  Administered 2014-02-09 (×2): 1 g via INTRAVENOUS
  Filled 2014-02-09 (×2): qty 50

## 2014-02-09 MED ORDER — SODIUM CHLORIDE 0.9 % IV SOLN
INTRAVENOUS | Status: DC
  Administered 2014-02-09: 75 mL/h via INTRAVENOUS
  Administered 2014-02-10: 08:00:00 via INTRAVENOUS

## 2014-02-09 MED ORDER — LISINOPRIL 20 MG PO TABS
20.0000 mg | ORAL_TABLET | Freq: Every day | ORAL | Status: DC
  Administered 2014-02-09 – 2014-02-13 (×3): 20 mg via ORAL
  Filled 2014-02-09 (×5): qty 1

## 2014-02-09 MED ORDER — HYDROCHLOROTHIAZIDE 25 MG PO TABS
25.0000 mg | ORAL_TABLET | Freq: Every day | ORAL | Status: DC
  Administered 2014-02-09 – 2014-02-13 (×3): 25 mg via ORAL
  Filled 2014-02-09 (×5): qty 1

## 2014-02-09 MED ORDER — METOCLOPRAMIDE HCL 10 MG PO TABS: 5.0000 mg | ORAL_TABLET | Freq: Three times a day (TID) | ORAL | Status: DC | PRN

## 2014-02-09 MED ORDER — EPHEDRINE SULFATE 50 MG/ML IJ SOLN
INTRAMUSCULAR | Status: AC
  Filled 2014-02-09: qty 1

## 2014-02-09 MED ORDER — SIMVASTATIN 10 MG PO TABS
10.0000 mg | ORAL_TABLET | Freq: Every day | ORAL | Status: DC
  Administered 2014-02-09 – 2014-02-12 (×4): 10 mg via ORAL
  Filled 2014-02-09 (×5): qty 1

## 2014-02-09 MED ORDER — VECURONIUM BROMIDE 10 MG IV SOLR
INTRAVENOUS | Status: DC | PRN
  Administered 2014-02-09: 2 mg via INTRAVENOUS

## 2014-02-09 MED ORDER — ROCURONIUM BROMIDE 50 MG/5ML IV SOLN
INTRAVENOUS | Status: AC
  Filled 2014-02-09: qty 1

## 2014-02-09 MED ORDER — DOCUSATE SODIUM 100 MG PO CAPS
100.0000 mg | ORAL_CAPSULE | Freq: Two times a day (BID) | ORAL | Status: DC
  Administered 2014-02-09 – 2014-02-13 (×9): 100 mg via ORAL
  Filled 2014-02-09 (×10): qty 1

## 2014-02-09 MED ORDER — ASPIRIN EC 325 MG PO TBEC
325.0000 mg | DELAYED_RELEASE_TABLET | Freq: Two times a day (BID) | ORAL | Status: DC
  Administered 2014-02-09 – 2014-02-12 (×6): 325 mg via ORAL
  Filled 2014-02-09 (×8): qty 1

## 2014-02-09 MED ORDER — METFORMIN HCL 500 MG PO TABS
500.0000 mg | ORAL_TABLET | Freq: Every day | ORAL | Status: DC
  Administered 2014-02-09 – 2014-02-12 (×4): 500 mg via ORAL
  Filled 2014-02-09 (×5): qty 1

## 2014-02-09 MED ORDER — MENTHOL 3 MG MT LOZG
1.0000 | LOZENGE | OROMUCOSAL | Status: DC | PRN
  Filled 2014-02-09: qty 9

## 2014-02-09 MED ORDER — SODIUM CHLORIDE 0.9 % IV SOLN
INTRAVENOUS | Status: DC | PRN
  Administered 2014-02-09: 08:00:00 via INTRAVENOUS

## 2014-02-09 MED ORDER — OXYCODONE HCL 5 MG/5ML PO SOLN: 5.0000 mg | Freq: Once | ORAL | Status: AC | PRN

## 2014-02-09 MED ORDER — BUPIVACAINE HCL (PF) 0.25 % IJ SOLN
INTRAMUSCULAR | Status: AC
  Filled 2014-02-09: qty 30

## 2014-02-09 MED ORDER — MIDAZOLAM HCL 2 MG/2ML IJ SOLN
INTRAMUSCULAR | Status: AC
  Filled 2014-02-09: qty 2

## 2014-02-09 MED ORDER — DEXAMETHASONE SODIUM PHOSPHATE 4 MG/ML IJ SOLN
INTRAMUSCULAR | Status: DC | PRN
  Administered 2014-02-09: 4 mg via INTRAVENOUS

## 2014-02-09 MED ORDER — LIDOCAINE HCL (CARDIAC) 20 MG/ML IV SOLN
INTRAVENOUS | Status: DC | PRN
  Administered 2014-02-09: 60 mg via INTRAVENOUS
  Administered 2014-02-09: 40 mg via INTRAVENOUS

## 2014-02-09 MED ORDER — ACETAMINOPHEN 650 MG RE SUPP: 650.0000 mg | Freq: Four times a day (QID) | RECTAL | Status: DC | PRN

## 2014-02-09 MED ORDER — KETOROLAC TROMETHAMINE 15 MG/ML IJ SOLN
7.5000 mg | Freq: Four times a day (QID) | INTRAMUSCULAR | Status: AC
  Administered 2014-02-09 – 2014-02-10 (×3): 7.5 mg via INTRAVENOUS
  Filled 2014-02-09: qty 1

## 2014-02-09 MED ORDER — METHOCARBAMOL 1000 MG/10ML IJ SOLN
500.0000 mg | Freq: Four times a day (QID) | INTRAVENOUS | Status: DC | PRN
  Filled 2014-02-09: qty 5

## 2014-02-09 MED ORDER — DEXAMETHASONE SODIUM PHOSPHATE 10 MG/ML IJ SOLN
INTRAMUSCULAR | Status: AC
  Filled 2014-02-09: qty 1

## 2014-02-09 MED ORDER — HYDROMORPHONE HCL PF 1 MG/ML IJ SOLN
0.2500 mg | INTRAMUSCULAR | Status: DC | PRN
  Administered 2014-02-09: 0.5 mg via INTRAVENOUS
  Administered 2014-02-09: 1 mg via INTRAVENOUS

## 2014-02-09 MED ORDER — PHENOL 1.4 % MT LIQD: 1.0000 | OROMUCOSAL | Status: DC | PRN

## 2014-02-09 MED ORDER — ALUM & MAG HYDROXIDE-SIMETH 200-200-20 MG/5ML PO SUSP: 30.0000 mL | ORAL | Status: DC | PRN

## 2014-02-09 MED ORDER — MIDAZOLAM HCL 5 MG/5ML IJ SOLN
INTRAMUSCULAR | Status: DC | PRN
  Administered 2014-02-09: 2 mg via INTRAVENOUS

## 2014-02-09 MED ORDER — PANTOPRAZOLE SODIUM 40 MG PO TBEC
40.0000 mg | DELAYED_RELEASE_TABLET | Freq: Every day | ORAL | Status: DC
  Administered 2014-02-10 – 2014-02-13 (×5): 40 mg via ORAL
  Filled 2014-02-09 (×5): qty 1

## 2014-02-09 MED ORDER — ARTIFICIAL TEARS OP OINT
TOPICAL_OINTMENT | OPHTHALMIC | Status: DC | PRN
  Administered 2014-02-09: 1 via OPHTHALMIC

## 2014-02-09 MED ORDER — OXYCODONE HCL 5 MG PO TABS
5.0000 mg | ORAL_TABLET | Freq: Once | ORAL | Status: AC | PRN
  Administered 2014-02-09: 5 mg via ORAL

## 2014-02-09 MED ORDER — SODIUM CHLORIDE 0.9 % IJ SOLN
INTRAMUSCULAR | Status: AC
  Filled 2014-02-09: qty 10

## 2014-02-09 MED ORDER — BUPIVACAINE-EPINEPHRINE (PF) 0.5% -1:200000 IJ SOLN
INTRAMUSCULAR | Status: DC | PRN
  Administered 2014-02-09: 30 mL via PERINEURAL

## 2014-02-09 MED ORDER — FENTANYL CITRATE 0.05 MG/ML IJ SOLN
INTRAMUSCULAR | Status: DC | PRN
  Administered 2014-02-09 (×2): 100 ug via INTRAVENOUS
  Administered 2014-02-09: 50 ug via INTRAVENOUS
  Administered 2014-02-09: 100 ug via INTRAVENOUS
  Administered 2014-02-09: 50 ug via INTRAVENOUS
  Administered 2014-02-09: 100 ug via INTRAVENOUS

## 2014-02-09 SURGICAL SUPPLY — 67 items
BANDAGE ELASTIC 6 VELCRO ST LF (GAUZE/BANDAGES/DRESSINGS) ×3 IMPLANT
BANDAGE ESMARK 6X9 LF (GAUZE/BANDAGES/DRESSINGS) ×1 IMPLANT
BLADE SAG 18X100X1.27 (BLADE) ×3 IMPLANT
BLADE SURG 10 STRL SS (BLADE) ×3 IMPLANT
BNDG ESMARK 6X9 LF (GAUZE/BANDAGES/DRESSINGS) ×3
BOWL SMART MIX CTS (DISPOSABLE) IMPLANT
CEMENT BONE SIMPLEX SPEEDSET (Cement) ×6 IMPLANT
CLOSURE WOUND 1/2 X4 (GAUZE/BANDAGES/DRESSINGS)
COVER SURGICAL LIGHT HANDLE (MISCELLANEOUS) ×3 IMPLANT
CUFF TOURNIQUET SINGLE 34IN LL (TOURNIQUET CUFF) ×3 IMPLANT
CUFF TOURNIQUET SINGLE 44IN (TOURNIQUET CUFF) IMPLANT
DRAPE INCISE IOBAN 66X45 STRL (DRAPES) ×3 IMPLANT
DRAPE ORTHO SPLIT 77X108 STRL (DRAPES) ×4
DRAPE PROXIMA HALF (DRAPES) ×3 IMPLANT
DRAPE SURG ORHT 6 SPLT 77X108 (DRAPES) ×2 IMPLANT
DRAPE U-SHAPE 47X51 STRL (DRAPES) ×3 IMPLANT
DRSG PAD ABDOMINAL 8X10 ST (GAUZE/BANDAGES/DRESSINGS) ×3 IMPLANT
DURAPREP 26ML APPLICATOR (WOUND CARE) ×3 IMPLANT
ELECT REM PT RETURN 9FT ADLT (ELECTROSURGICAL) ×3
ELECTRODE REM PT RTRN 9FT ADLT (ELECTROSURGICAL) ×1 IMPLANT
EVACUATOR 1/8 PVC DRAIN (DRAIN) ×3 IMPLANT
FACESHIELD WRAPAROUND (MASK) ×9 IMPLANT
GAUZE XEROFORM 1X8 LF (GAUZE/BANDAGES/DRESSINGS) IMPLANT
GAUZE XEROFORM 5X9 LF (GAUZE/BANDAGES/DRESSINGS) ×3 IMPLANT
GLOVE BIO SURGEON STRL SZ8 (GLOVE) ×6 IMPLANT
GLOVE BIOGEL PI IND STRL 7.0 (GLOVE) ×2 IMPLANT
GLOVE BIOGEL PI IND STRL 7.5 (GLOVE) ×1 IMPLANT
GLOVE BIOGEL PI IND STRL 8 (GLOVE) ×3 IMPLANT
GLOVE BIOGEL PI INDICATOR 7.0 (GLOVE) ×4
GLOVE BIOGEL PI INDICATOR 7.5 (GLOVE) ×2
GLOVE BIOGEL PI INDICATOR 8 (GLOVE) ×6
GLOVE BIOGEL PI ORTHO PRO SZ7 (GLOVE) ×2
GLOVE ECLIPSE 7.0 STRL STRAW (GLOVE) IMPLANT
GLOVE ORTHO TXT STRL SZ7.5 (GLOVE) ×3 IMPLANT
GLOVE PI ORTHO PRO STRL SZ7 (GLOVE) ×1 IMPLANT
GOWN STRL REUS W/ TWL LRG LVL3 (GOWN DISPOSABLE) ×2 IMPLANT
GOWN STRL REUS W/ TWL XL LVL3 (GOWN DISPOSABLE) ×1 IMPLANT
GOWN STRL REUS W/TWL LRG LVL3 (GOWN DISPOSABLE) ×4
GOWN STRL REUS W/TWL XL LVL3 (GOWN DISPOSABLE) ×2
HANDPIECE INTERPULSE COAX TIP (DISPOSABLE) ×2
IMMOBILIZER KNEE 22 UNIV (SOFTGOODS) IMPLANT
KIT BASIN OR (CUSTOM PROCEDURE TRAY) ×3 IMPLANT
KIT ROOM TURNOVER OR (KITS) ×3 IMPLANT
KNEE/VIT E POLY LINER LEVEL 1B ×3 IMPLANT
MANIFOLD NEPTUNE II (INSTRUMENTS) ×3 IMPLANT
NS IRRIG 1000ML POUR BTL (IV SOLUTION) ×3 IMPLANT
PACK TOTAL JOINT (CUSTOM PROCEDURE TRAY) ×3 IMPLANT
PAD ABD 8X10 STRL (GAUZE/BANDAGES/DRESSINGS) ×6 IMPLANT
PAD ARMBOARD 7.5X6 YLW CONV (MISCELLANEOUS) ×3 IMPLANT
PADDING CAST COTTON 6X4 STRL (CAST SUPPLIES) ×3 IMPLANT
SET HNDPC FAN SPRY TIP SCT (DISPOSABLE) ×1 IMPLANT
SET PAD KNEE POSITIONER (MISCELLANEOUS) ×3 IMPLANT
SPONGE GAUZE 4X4 12PLY (GAUZE/BANDAGES/DRESSINGS) ×3 IMPLANT
SPONGE GAUZE 4X4 12PLY STER LF (GAUZE/BANDAGES/DRESSINGS) ×3 IMPLANT
STAPLER VISISTAT 35W (STAPLE) IMPLANT
STRIP CLOSURE SKIN 1/2X4 (GAUZE/BANDAGES/DRESSINGS) IMPLANT
SUCTION FRAZIER TIP 10 FR DISP (SUCTIONS) ×3 IMPLANT
SUT VIC AB 1 CT1 27 (SUTURE) ×4
SUT VIC AB 1 CT1 27XBRD ANBCTR (SUTURE) ×2 IMPLANT
SUT VIC AB 2-0 CT1 27 (SUTURE) ×4
SUT VIC AB 2-0 CT1 TAPERPNT 27 (SUTURE) ×2 IMPLANT
SUT VIC AB 4-0 PS2 27 (SUTURE) ×3 IMPLANT
TOWEL OR 17X24 6PK STRL BLUE (TOWEL DISPOSABLE) ×3 IMPLANT
TOWEL OR 17X26 10 PK STRL BLUE (TOWEL DISPOSABLE) ×3 IMPLANT
TRAY FOLEY CATH 16FRSI W/METER (SET/KITS/TRAYS/PACK) ×3 IMPLANT
WATER STERILE IRR 1000ML POUR (IV SOLUTION) ×3 IMPLANT
WRAP KNEE MAXI GEL POST OP (GAUZE/BANDAGES/DRESSINGS) ×3 IMPLANT

## 2014-02-09 NOTE — Evaluation (Signed)
Physical Therapy Evaluation Patient Details Name: [name] MRN: [id number] DOB: 16-Sep-1950 Today's Date: 02/09/2014   History of Present Illness  63 y.o. female s/p left TKA. Hx of HTN, asthma, GERD,  Diabetes, fibromyalgia, and cancer.  Clinical Impression  Pt is s/p left TKA resulting in the deficits listed below (see PT Problem List). Patient is min-mod assist with all mobility at this time, having difficulty with transfers due to left knee pain and history of right knee arthrits. Pt will benefit from skilled PT to increase their independence and safety with mobility to allow discharge to the venue listed below.      Follow Up Recommendations SNF    Equipment Recommendations  Rolling walker with 5" wheels;3in1 (PT)    Recommendations for Other Services       Precautions / Restrictions Precautions Precautions: Knee Precaution Comments: Reviewed knee precautions. Required Braces or Orthoses: Knee Immobilizer - Left Restrictions Weight Bearing Restrictions: Yes LLE Weight Bearing: Weight bearing as tolerated      Mobility  Bed Mobility Overal bed mobility: Needs Assistance Bed Mobility: Supine to Sit     Supine to sit: Min assist;HOB elevated     General bed mobility comments: Min assist for LLE support off of bed. Heavy use of bed rail and VCs for technique.  Transfers Overall transfer level: Needs assistance Equipment used: Rolling walker (2 wheeled) Transfers: Sit to/from [facility] Sit to Stand: Mod assist Stand pivot transfers: Min assist       General transfer comment: Mod assist for sit<>stand transition for boost. Required several attempts but able to perform eventually. VCs for hand placement. Min assist with stand pivot transfer to chair. Required max verbal cues for technique and sequencing. Educated on safe DME use and control of walker with pivots.  Ambulation/Gait                Stairs            Wheelchair  Mobility    Modified Rankin (Stroke Patients Only)       Balance Overall balance assessment: Needs assistance Sitting-balance support: No upper extremity supported;Feet supported Sitting balance-Leahy Scale: Fair     Standing balance support: Bilateral upper extremity supported Standing balance-Leahy Scale: Poor                               Pertinent Vitals/Pain Pt reports pain as high Nurse aware - administered pain medication prior to starting therapy session Patient repositioned in chair for comfort.     Home Living Family/patient expects to be discharged to:: Skilled nursing facility Living Arrangements: Alone Available Help at Discharge: [place] Type of Home: House Home Access: Stairs to enter Entrance Stairs-Rails: None Entrance Stairs-Number of Steps: 1 Home Layout: One level Home Equipment: None      Prior Function Level of Independence: Independent with assistive device(s)   Gait / Transfers Assistance Needed: Used a friends RW to ambulate intermittently           Hand Dominance   Dominant Hand: Right    Extremity/Trunk Assessment   Upper Extremity Assessment: Defer to OT evaluation           Lower Extremity Assessment: LLE deficits/detail   LLE Deficits / Details: decreased strength and ROM as expected post op TKA     Communication   Communication: No difficulties  Cognition Arousal/Alertness: Lethargic;Suspect due to medications Behavior During Therapy: [facility] for tasks  assessed/performed Overall Cognitive Status: Within Functional Limits for tasks assessed                      General Comments General comments (skin integrity, edema, etc.): Pts daughters present during therapy question. Very involved with care and many questions were answered concerning d/c plans. Case management entered room and spoke with family who feel they have had their questions answered.    Exercises Total Joint  Exercises Ankle Circles/Pumps: AROM;Both;10 reps;Supine Quad Sets: AROM;Left;10 reps;Supine      Assessment/Plan    PT Assessment Patient needs continued PT services  PT Diagnosis Difficulty walking;Acute pain   PT Problem List Decreased strength;Decreased range of motion;Decreased activity tolerance;Decreased balance;Decreased mobility;Decreased coordination;Decreased knowledge of use of DME;Impaired sensation;Obesity;Pain  PT Treatment Interventions DME instruction;Gait training;Stair training;Functional mobility training;Therapeutic activities;Therapeutic exercise;Patient/family education;Modalities;Balance training;Neuromuscular re-education   PT Goals (Current goals can be found in the Care Plan section) Acute Rehab PT Goals Patient Stated Goal: Go to rehab before returning home PT Goal Formulation: With patient Time For Goal Achievement: 02/16/14 Potential to Achieve Goals: Good    Frequency 7X/week   Barriers to discharge Decreased caregiver support pt lives alone during the day    Co-evaluation               End of Session Equipment Utilized During Treatment: Left knee immobilizer Activity Tolerance: Patient tolerated treatment well Patient left: in chair;with call bell/phone within reach;with family/visitor present Nurse Communication: Mobility status         Time: 0350-0938 PT Time Calculation (min): 57 min   Charges:   PT Evaluation $Initial PT Evaluation Tier I: 1 Procedure PT Treatments $Therapeutic Activity: 23-37 mins $Self Care/Home Management: 8-22   PT G Codes:         Elayne Snare, [place]  Ellouise Newer 02/09/2014, 3:24 PM

## 2014-02-09 NOTE — Anesthesia Preprocedure Evaluation (Signed)
Anesthesia Evaluation  Patient identified by MRN, date of birth, ID band Patient awake    Reviewed: Allergy & Precautions, H&P , NPO status , Patient's Chart, lab work & pertinent test results  Airway Mallampati: II  Neck ROM: full    Dental   Pulmonary asthma ,          Cardiovascular hypertension,     Neuro/Psych  Neuromuscular disease    GI/Hepatic GERD-  ,  Endo/Other  diabetes, Type 2obese  Renal/GU      Musculoskeletal  (+) Arthritis -, Fibromyalgia -  Abdominal   Peds  Hematology   Anesthesia Other Findings   Reproductive/Obstetrics                           Anesthesia Physical Anesthesia Plan  ASA: II  Anesthesia Plan: General and Regional   Post-op Pain Management: MAC Combined w/ Regional for Post-op pain   Induction: Intravenous  Airway Management Planned: Oral ETT  Additional Equipment:   Intra-op Plan:   Post-operative Plan: Extubation in OR  Informed Consent: I have reviewed the patients History and Physical, chart, labs and discussed the procedure including the risks, benefits and alternatives for the proposed anesthesia with the patient or authorized representative who has indicated his/her understanding and acceptance.     Plan Discussed with: CRNA, Anesthesiologist and Surgeon  Anesthesia Plan Comments:         Anesthesia Quick Evaluation

## 2014-02-09 NOTE — Op Note (Signed)
[name], [name] NO.:  [id number]  MEDICAL RECORD NO.:  [id number]  LOCATION:  5N09C                        FACILITY:  [facility]  PHYSICIAN:  [name], M.D.DATE OF BIRTH:  1951-04-17  DATE OF PROCEDURE:  02/09/2014 DATE OF DISCHARGE:                              OPERATIVE REPORT   PREOPERATIVE DIAGNOSIS:  End-stage osteoarthritis and degenerative joint disease, left knee.  POSTOPERATIVE DIAGNOSIS:  End-stage osteoarthritis and degenerative joint disease, left knee.  PROCEDURE:  Left total knee arthroplasty.  IMPLANTS:  Stryker triathlon knee with size 2 femur, size 3 tibial tray, 11 mm fix bearing polyethylene insert, size 29 symmetric patella button.  SURGEON:  Carla Guest. Ninfa Linden, MD.  ASSISTANT:  Shary Decamp, physician assistant student 2.  ANESTHESIA: 1. Leg femoral nerve block. 2. General.  ANTIBIOTICS:  2 g IV Ancef.  BLOOD LOSS:  Less than 100 mL.  TOURNIQUET TIME:  Less than 2 hours.  COMPLICATIONS:  None.  INDICATIONS:  Ms. Holstrom is a 63 year old female, I have seen for some time now.  She has a long history of bilateral knee pain, with well documented evidence of severe osteoarthritis of both her knees.  Her left knee hurts more severe.  She has failed all modes of conservative treatment.  At this point, wished to proceed with a total knee arthroplasty given the impact of her pain on her daily life for decreased mobility, and her poor quality of life in general.  She understands the risks of acute blood loss anemia, nerve and vessel injury, fracture, infection, and DVT.  She understands the goals are decreased pain, improved mobility and overall improved quality of life.  PROCEDURE DESCRIPTION:  After informed consent was obtained, appropriate left leg was marked, anesthesia obtained with femoral nerve block.  She was then brought to the operating room, placed supine on the operating table.  General anesthesia was  then obtained.  A Foley catheter was placed and then a nonsterile tourniquet was placed around her upper left thigh.  Her left leg was then prepped and draped with DuraPrep and sterile drapes including sterile stockinette.  Time-out was called and she was identified as correct patient, correct left knee.  We then used an Esmarch to wrap out the leg and the tourniquet was inflated to 300 mm of pressure.  We then made a direct midline incision of the patella and carried this proximally and distally.  We dissected of the knee joint and performed a medial parapatellar arthrotomy.  We inverted the patella and flexed the knee and removed the knee of any osteophytes and other debris.  We found the knee on the medial compartment, completely devoid of cartilage and significant osteophytes and cartilage thinning throughout.  There was no remnant medial meniscus left.  With the knee flexed, we used the extramedullary tibial cutting guide, we set this to take 9 mm off the high side.  We put for 0 slope and corrected the varus valgus.  We then made our tibial cut.  We then went to the femur with the knee flexed.  We used an intramedullary guide for the femur through drill.  We made this for 10 mm cut  off the distal femur.  With her knee back down, we trialed our extension block and we were pleased with the extension.  We then went back to flex position.  We put this femoral sizing guide and sized for a size 2 femur.  We then put the 4 in 1 cutting block.  I made our anterior and posterior cuts followed by our chamfer cuts.  We then made our femoral box cut.  We then went back to the tibia and trialed for a size 3 tibia, there we felt where in the tibial tray fit.  We made our keel punch for this and then with a 3 tibial tray, and the 2 femur trial implants, we placed a 9 mm polyethylene insert.  We rolled the knee back down, I felt she hyperextended a little bit into the 11 mm poly fit better.  We then  cut our patella and taking about 9 mm off the patella and drilled lugs for size 29 patellar button.  We then removed all trial implants and copiously irrigated the knee with normal saline solution using pulsatile lavage.  We then mixed our cement and cemented the real Stryker Triathlon tibial tray, size 3, the real size 2 femur.  We cleaned the cement debris from the knee and then placed the real 11 mm fix bearing polyethylene insert.  We then cemented 29 patellar button.  Once the cement had hardened, we let the tourniquet down.  Hemostasis was obtained with electrocautery.  We copiously irrigated the knee with normal saline solution.  Again using pulsatile lavage, we placed a medium Hemovac in the arthrotomy.  We closed our arthrotomy with interrupted #1 Vicryl suture followed by 0 Vicryl in the deep tissue, 2- 0 Vicryl in subcutaneous tissue, interrupted staples on the skin. Xeroform and well-padded sterile dressing was applied.  She was awakened, extubated, and taken to recovery room in stable condition. All final counts were correct.  There were no complications noted.     [name], M.D.     CYB/MEDQ  D:  02/09/2014  T:  02/09/2014  Job:  [id number]

## 2014-02-09 NOTE — Care Management Note (Signed)
CARE MANAGEMENT NOTE 02/09/2014  Patient:  MARIEA, MCMARTIN   Account Number:  [id number]  Date Initiated:  02/09/2014  Documentation initiated by:  Ricki Miller  Subjective/Objective Assessment:   63 yr old female s/p left total knee arthroplasty.     Action/Plan:   PT/OT Eval  Case manager will follow.Patient preoperatively setup with [facility]. Patient's family states she will go to [facility]. Social worker notified.   Anticipated DC Date:  02/12/2014   Anticipated DC Plan:  [place]  CM consult      [facility]  HOME HEALTH   Choice offered to / List presented to:             Status of service:  In process, will continue to follow Medicare Important Message given?   (If response is "NO", the following Medicare IM given date fields will be blank) Date Medicare IM given:   Medicare IM given by:   Date Additional Medicare IM given:   Additional Medicare IM given by:    Discharge Disposition:    Per UR Regulation:  Reviewed for med. necessity/level of care/duration of stay

## 2014-02-09 NOTE — Transfer of Care (Signed)
Immediate Anesthesia Transfer of Care Note  Patient: [name]  Procedure(s) Performed: Procedure(s): LEFT TOTAL KNEE ARTHROPLASTY (Left)  Patient Location: PACU  Anesthesia Type:GA combined with regional for post-op pain  Level of Consciousness: sedated, patient cooperative and responds to stimulation  Airway & Oxygen Therapy: Patient Spontanous Breathing and Patient connected to nasal cannula oxygen  Post-op Assessment: Report given to PACU RN, Post -op Vital signs reviewed and stable, Patient moving all extremities and Patient moving all extremities X 4  Post vital signs: Reviewed and stable  Complications: No apparent anesthesia complications

## 2014-02-09 NOTE — Anesthesia Postprocedure Evaluation (Signed)
Anesthesia Post Note  Patient: [name]  Procedure(s) Performed: Procedure(s) (LRB): LEFT TOTAL KNEE ARTHROPLASTY (Left)  Anesthesia type: General  Patient location: PACU  Post pain: Pain level controlled and Adequate analgesia  Post assessment: Post-op Vital signs reviewed, Patient's Cardiovascular Status Stable, Respiratory Function Stable, Patent Airway and Pain level controlled  Last Vitals:  Filed Vitals:   02/09/14 1115  BP: 104/50  Pulse: 46  Temp: 36.3 C  Resp: 12    Post vital signs: Reviewed and stable  Level of consciousness: awake, alert  and oriented  Complications: No apparent anesthesia complications

## 2014-02-09 NOTE — Anesthesia Procedure Notes (Addendum)
Anesthesia Regional Block:  Femoral nerve block  Pre-Anesthetic Checklist: ,, timeout performed, Correct Patient, Correct Site, Correct Laterality, Correct Procedure,, site marked, risks and benefits discussed, Surgical consent,  Pre-op evaluation,  At surgeon's request and post-op pain management  Laterality: Left  Prep: chloraprep       Needles:  Injection technique: Single-shot  Needle Type: Echogenic Stimulator Needle     Needle Length: 9cm 9 cm Needle Gauge: 21 and 21 G    Additional Needles:  Procedures: nerve stimulator Femoral nerve block  Nerve Stimulator or Paresthesia:  Response: Quadriceps muscle contraction, 0.45 mA,   Additional Responses:   Narrative:  Start time: 02/09/2014 7:02 AM End time: 02/09/2014 7:15 AM Injection made incrementally with aspirations every 5 mL.  Performed by: Personally  Anesthesiologist: Dr Marcie Bal  Additional Notes: Functioning IV was confirmed and monitors were applied.  A 32mm 21ga Arrow echogenic stimulator needle was used. Sterile prep and drape,hand hygiene and sterile gloves were used.  Negative aspiration and negative test dose prior to incremental administration of local anesthetic. The patient tolerated the procedure well.     Procedure Name: Intubation Date/Time: 02/09/2014 7:42 AM Performed by: Jacquiline Doe A Pre-anesthesia Checklist: Patient identified, Timeout performed, Emergency Drugs available, Suction available and Patient being monitored Patient Re-evaluated:Patient Re-evaluated prior to inductionOxygen Delivery Method: Circle system utilized Preoxygenation: Pre-oxygenation with 100% oxygen Intubation Type: IV induction and Cricoid Pressure applied Ventilation: Mask ventilation without difficulty Laryngoscope Size: Mac and 4 Grade View: Grade I Tube type: Oral Tube size: 7.5 mm Number of attempts: 2 Airway Equipment and Method: Stylet Placement Confirmation: ETT inserted through vocal cords under direct  vision,  breath sounds checked- equal and bilateral and positive ETCO2 Secured at: 22 cm Tube secured with: Tape Dental Injury: Teeth and Oropharynx as per pre-operative assessment  Difficulty Due To: Difficulty was anticipated, Difficult Airway- due to dentition and Difficult Airway- due to anterior larynx

## 2014-02-09 NOTE — H&P (Signed)
TOTAL KNEE ADMISSION H&P  Patient is being admitted for left total knee arthroplasty.  Subjective:  Chief Complaint:left knee pain.  HPI: [name], 63 y.o. female, has a history of pain and functional disability in the left knee due to arthritis and has failed non-surgical conservative treatments for greater than 12 weeks to includeNSAID's and/or analgesics, corticosteriod injections, viscosupplementation injections, use of assistive devices, weight reduction as appropriate and activity modification.  Onset of symptoms was gradual, starting 3 years ago with stable course since that time. The patient noted no past surgery on the left knee(s).  Patient currently rates pain in the left knee(s) at 10 out of 10 with activity. Patient has night pain, worsening of pain with activity and weight bearing, pain that interferes with activities of daily living, pain with passive range of motion, crepitus and joint swelling.  Patient has evidence of subchondral sclerosis, periarticular osteophytes and joint space narrowing by imaging studies. There is no active infection.  Patient Active Problem List   Diagnosis Date Noted  . Arthritis of knee, left 02/09/2014   Past Medical History  Diagnosis Date  . Asthma   . Hypertension   . Diabetes mellitus without complication   . Elevated cholesterol   . GERD (gastroesophageal reflux disease)   . Arthritis   . Fibromyalgia   . Cancer   . Rectal cancer 2003    Past Surgical History  Procedure Laterality Date  . Colon resection  2013  . Abdominal hysterectomy    . Breast cyst excision    . Insertion of mesh      Abdominal mesh placed after chemotherapy    Prescriptions prior to admission  Medication Sig Dispense Refill  . HYDROcodone-acetaminophen (NORCO/VICODIN) 5-325 MG per tablet Take 1-2 tablets by mouth every 8 (eight) hours as needed for moderate pain.      Marland Kitchen lisinopril-hydrochlorothiazide (PRINZIDE,ZESTORETIC) 20-25 MG per tablet Take 1 tablet  by mouth daily.      . metFORMIN (GLUCOPHAGE) 500 MG tablet Take 500 mg by mouth daily after supper.      . pravastatin (PRAVACHOL) 20 MG tablet Take 20 mg by mouth at bedtime.       . ranitidine (ZANTAC) 300 MG tablet Take 300 mg by mouth daily as needed for heartburn.       Allergies  Allergen Reactions  . Morphine And Related     Felt like "burning up"  . Tramadol     dizzy    History  Substance Use Topics  . Smoking status: Never Smoker   . Smokeless tobacco: Not on file  . Alcohol Use: No    No family history on file.   Review of Systems  Musculoskeletal: Positive for joint pain.  All other systems reviewed and are negative.   Objective:  Physical Exam  Constitutional: She is oriented to person, place, and time. She appears well-developed and well-nourished.  HENT:  Head: Normocephalic and atraumatic.  Eyes: EOM are normal. Pupils are equal, round, and reactive to light.  Neck: Normal range of motion. Neck supple.  Cardiovascular: Normal rate and regular rhythm.   Respiratory: Effort normal and breath sounds normal.  GI: Soft. Bowel sounds are normal.  Musculoskeletal:       Left knee: She exhibits decreased range of motion, effusion and abnormal alignment. Tenderness found. Medial joint line and lateral joint line tenderness noted.  Neurological: She is alert and oriented to person, place, and time.  Skin: Skin is warm and dry.  Psychiatric:  She has a normal mood and affect.    Vital signs in last 24 hours: Temp:  [98 F (36.7 C)] 98 F (36.7 C) (07/03 0609) Pulse Rate:  [79] 79 (07/03 0609) Resp:  [18] 18 (07/03 0609) BP: (134)/(86) 134/86 mmHg (07/03 0609) SpO2:  [96 %] 96 % (07/03 0609)  Labs:   Estimated body mass index is 37.30 kg/(m^2) as calculated from the following:   Height as of 01/29/14: 5\' 2"  (1.575 m).   Weight as of 12/23/13: 92.534 kg (204 lb).   Imaging Review Plain radiographs demonstrate severe degenerative joint disease of the left  knee(s). The overall alignment ismild varus. The bone quality appears to be good for age and reported activity level.  Assessment/Plan:  End stage arthritis, left knee   The patient history, physical examination, clinical judgment of the provider and imaging studies are consistent with end stage degenerative joint disease of the left knee(s) and total knee arthroplasty is deemed medically necessary. The treatment options including medical management, injection therapy arthroscopy and arthroplasty were discussed at length. The risks and benefits of total knee arthroplasty were presented and reviewed. The risks due to aseptic loosening, infection, stiffness, patella tracking problems, thromboembolic complications and other imponderables were discussed. The patient acknowledged the explanation, agreed to proceed with the plan and consent was signed. Patient is being admitted for inpatient treatment for surgery, pain control, PT, OT, prophylactic antibiotics, VTE prophylaxis, progressive ambulation and ADL's and discharge planning. The patient is planning to be discharged home with home health services

## 2014-02-09 NOTE — Progress Notes (Signed)
Utilization Review Completed.Donne Anon T7/10/2013

## 2014-02-09 NOTE — Progress Notes (Signed)
Orthopedic Tech Progress Note Patient Details:  [name] 1951-05-21 [id number]  CPM Left Knee CPM Left Knee: On Left Knee Flexion (Degrees): 60 Left Knee Extension (Degrees): 0 Additional Comments: Trapeze bar   Cammer, Theodoro Parma 02/09/2014, 11:25 AM

## 2014-02-09 NOTE — Brief Op Note (Signed)
02/09/2014  9:45 AM  PATIENT:  [name]  63 y.o. female  PRE-OPERATIVE DIAGNOSIS:  Severe osteoarthritis left knee  POST-OPERATIVE DIAGNOSIS:  Severe osteoarthritis left knee  PROCEDURE:  Procedure(s): LEFT TOTAL KNEE ARTHROPLASTY (Left)  SURGEON:  Surgeon(s) and Role:    * Mcarthur Rossetti, MD - Primary  ASSISTANTS: Shary Decamp, PAS2   ANESTHESIA:   regional, general  EBL:  Total I/O In: 2050 [I.V.:2050] Out: 600 [Urine:500; Blood:100]  BLOOD ADMINISTERED:none  LOCAL MEDICATIONS USED:  NONE  SPECIMEN:  No Specimen  DISPOSITION OF SPECIMEN:  N/A  COUNTS:  YES  TOURNIQUET:   Total Tourniquet Time Documented: Thigh (Left) - 62 minutes Total: Thigh (Left) - 62 minutes   DICTATION: .Other Dictation: Dictation Number [id number]  PLAN OF CARE: Admit to inpatient   PATIENT DISPOSITION:  PACU - hemodynamically stable.   Delay start of Pharmacological VTE agent (>24hrs) due to surgical blood loss or risk of bleeding: no

## 2014-02-10 LAB — GLUCOSE, CAPILLARY
GLUCOSE-CAPILLARY: 133 mg/dL — AB (ref 70–99)
GLUCOSE-CAPILLARY: 132 mg/dL — AB (ref 70–99)
Glucose-Capillary: 111 mg/dL — ABNORMAL HIGH (ref 70–99)
Glucose-Capillary: 181 mg/dL — ABNORMAL HIGH (ref 70–99)

## 2014-02-10 LAB — BASIC METABOLIC PANEL
ANION GAP: 11 (ref 5–15)
BUN: 12 mg/dL (ref 6–23)
CALCIUM: 9 mg/dL (ref 8.4–10.5)
CHLORIDE: 102 meq/L (ref 96–112)
CO2: 28 mEq/L (ref 19–32)
Creatinine, Ser: 0.66 mg/dL (ref 0.50–1.10)
GFR calc Af Amer: >90 mL/min (ref >90)
GFR calc non Af Amer: >90 mL/min (ref >90)
Glucose, Bld: 135 mg/dL — ABNORMAL HIGH (ref 70–99)
Potassium: 4.4 mEq/L (ref 3.7–5.3)
SODIUM: 141 meq/L (ref 137–147)

## 2014-02-10 LAB — CBC
HCT: 30 % — ABNORMAL LOW (ref 36.0–46.0)
Hemoglobin: 9.6 g/dL — ABNORMAL LOW (ref 12.0–15.0)
MCH: 28.6 pg (ref 26.0–34.0)
MCHC: 32 g/dL (ref 30.0–36.0)
MCV: 89.3 fL (ref 78.0–100.0)
PLATELETS: 215 10*3/uL (ref 150–400)
RBC: 3.36 MIL/uL — ABNORMAL LOW (ref 3.87–5.11)
RDW: 13.9 % (ref 11.5–15.5)
WBC: 9.7 10*3/uL (ref 4.0–10.5)

## 2014-02-10 MED ORDER — OXYCODONE HCL 5 MG PO TABS
5.0000 mg | ORAL_TABLET | ORAL | Status: DC | PRN
  Administered 2014-02-10 – 2014-02-12 (×6): 10 mg via ORAL
  Administered 2014-02-12: 5 mg via ORAL
  Administered 2014-02-12: 15 mg via ORAL
  Administered 2014-02-12: 10 mg via ORAL
  Administered 2014-02-13 (×2): 15 mg via ORAL
  Filled 2014-02-10: qty 2
  Filled 2014-02-10 (×2): qty 3
  Filled 2014-02-10 (×4): qty 2
  Filled 2014-02-10: qty 3
  Filled 2014-02-10 (×3): qty 2

## 2014-02-10 MED ORDER — DIAZEPAM 5 MG PO TABS
5.0000 mg | ORAL_TABLET | Freq: Four times a day (QID) | ORAL | Status: DC | PRN
  Administered 2014-02-10 – 2014-02-11 (×3): 5 mg via ORAL
  Filled 2014-02-10 (×3): qty 1

## 2014-02-10 MED ORDER — KETOROLAC TROMETHAMINE 30 MG/ML IJ SOLN
30.0000 mg | Freq: Once | INTRAMUSCULAR | Status: AC
  Administered 2014-02-10: 30 mg via INTRAVENOUS
  Filled 2014-02-10: qty 1

## 2014-02-10 NOTE — Progress Notes (Signed)
Subjective: 1 Day Post-Op Procedure(s) (LRB): LEFT TOTAL KNEE ARTHROPLASTY (Left) Patient reports pain as moderate to severe.    Objective: Vital signs in last 24 hours: Temp:  [96.4 F (35.8 C)-98 F (36.7 C)] 98 F (36.7 C) (07/04 0452) Pulse Rate:  [46-82] 69 (07/04 0452) Resp:  [10-27] 16 (07/04 0452) BP: (91-119)/(46-69) 91/50 mmHg (07/04 0452) SpO2:  [85 %-100 %] 97 % (07/04 0452) Weight:  [88.905 kg (196 lb)] 88.905 kg (196 lb) (07/03 1135)  Intake/Output from previous day: 07/03 0701 - 07/04 0700 In: 2250 [I.V.:2250] Out: 2175 [Urine:1905; Drains:170; Blood:100] Intake/Output this shift: Total I/O In: -  Out: 460 [Urine:450; Drains:10]   Recent Labs  02/10/14 0428  HGB 9.6*    Recent Labs  02/10/14 0428  WBC 9.7  RBC 3.36*  HCT 30.0*  PLT 215    Recent Labs  02/10/14 0428  NA 141  K 4.4  CL 102  CO2 28  BUN 12  CREATININE 0.66  GLUCOSE 135*  CALCIUM 9.0   No results found for this basename: LABPT, INR,  in the last 72 hours  Neurovascular intact Sensation intact distally Dorsiflexion/Plantar flexion intact Incision: dressing C/D/I Compartment soft  Assessment/Plan: 1 Day Post-Op Procedure(s) (LRB): LEFT TOTAL KNEE ARTHROPLASTY (Left) Up with therapy Monitor for symptoms of Post-op anemia  Hold Lisinopril this AM and for systolic less than 500 Social worker consult for SNF placement   [place], [place] 02/10/2014, 8:16 AM

## 2014-02-10 NOTE — Progress Notes (Signed)
Orthopedic Tech Progress Note Patient Details:  [name] 04-03-1951 [id number]  Patient ID: [name], female   DOB: [birth year], 63 y.o.   MRN: [id number] Placed pt's lle in cpm @ 0-60 degrees @ [place], [place] 02/10/2014, 3:08 PM

## 2014-02-10 NOTE — Progress Notes (Signed)
Clinical Social Work Department BRIEF PSYCHOSOCIAL ASSESSMENT 02/10/2014  Patient:  [name], [name]     Account Number:  [id number]     Admit date:  02/09/2014  Clinical Social Worker:  Rolinda Roan  Date/Time:  02/10/2014 05:45 PM  Referred by:  Physician  Date Referred:  02/10/2014 Referred for  SNF Placement   Other Referral:   Interview type:  Patient Other interview type:    PSYCHOSOCIAL DATA Living Status:  WITH ADULT CHILDREN Admitted from facility:   Level of care:   Primary support name:  [name] [phone] Primary support relationship to patient:  CHILD, ADULT Degree of support available:   Good support at bedside.    CURRENT CONCERNS  Other Concerns:    SOCIAL WORK ASSESSMENT / PLAN Clinical Social Worker (CSW) met with patient and her daughter [name], son in law, and two granddaughters were at bedside. Patient reported that she lives with her daughter in [place] and prefers [facility] in [place]. Patient reported that she toured [facility] before surgery and talked with admissions coordinator. CSW encouraged patient to consider a second SNF in case [facility] does not have a bed. Patient verbalized her understanding.   Assessment/plan status:  Psychosocial Support/Ongoing Assessment of Needs Other assessment/ plan:   Information/referral to community resources:   CSW gave patient SNF list.    PATIENT'S/FAMILY'S RESPONSE TO PLAN OF CARE: Patient and daughter thanked CSW for visit and assisting with placement process.

## 2014-02-10 NOTE — Evaluation (Signed)
Occupational Therapy Evaluation and Discharge Patient Details Name: [name] MRN: [id number] DOB: 03-22-1951 Today's Date: 02/10/2014    History of Present Illness 63 y.o. female s/p left TKA. Hx of HTN, asthma, GERD,  Diabetes, fibromyalgia, and cancer.   Clinical Impression   This 63 yo female admitted and underwent above presents to acute OT with decreased balance, increased pain, decreased mobility, left sided sciatica pain all affecting pt's ability to care for herself. She will benefit from continued OT at SNF, D/C from acute OT.    Follow Up Recommendations  SNF    Equipment Recommendations   (TBD at next venue)       Precautions / Restrictions Precautions Precautions: Knee Restrictions Weight Bearing Restrictions: No LLE Weight Bearing: Weight bearing as tolerated      Mobility Bed Mobility Overal bed mobility: Needs Assistance Bed Mobility: Supine to Sit;Sit to Supine     Supine to sit: Min assist;HOB elevated Sit to supine: Min assist   General bed mobility comments: A for LLE and for safe hand placement  Transfers Overall transfer level: Needs assistance Equipment used: Rolling walker (2 wheeled) Transfers: Sit to/from Stand Sit to Stand: Min assist         General transfer comment: VCs for safe hand placement    Balance Overall balance assessment: Needs assistance Sitting-balance support: Feet supported;Single extremity supported Sitting balance-Leahy Scale: Poor (due to sciatica pain)     Standing balance support: Bilateral upper extremity supported Standing balance-Leahy Scale: Poor                              ADL Overall ADL's : Needs assistance/impaired Eating/Feeding: Independent;Sitting   Grooming: Min guard;Standing;Wash/dry hands   Upper Body Bathing: Set up;Sitting   Lower Body Bathing: Moderate assistance;Sit to/from stand   Upper Body Dressing : Set up;Sitting   Lower Body Dressing: Maximal assistance;Sit  to/from stand   Toilet Transfer: Minimal assistance;Ambulation;Comfort height toilet;Grab bars;RW   Toileting- Water quality scientist and Hygiene: Min guard;Sit to/from stand                         Pertinent Vitals/Pain 10/10 left sciatic pain; RN made aware and brought in IV pain meds     Hand Dominance  Right   Extremity/Trunk Assessment Upper Extremity Assessment Upper Extremity Assessment: Overall WFL for tasks assessed              Cognition Arousal/Alertness: Awake/alert Behavior During Therapy: WFL for tasks assessed/performed Overall Cognitive Status: Within Functional Limits for tasks assessed                                Home Living Family/patient expects to be discharged to:: Skilled nursing facility                                                OT Problem List: Decreased range of motion;Decreased activity tolerance;Pain;Impaired balance (sitting and/or standing);Decreased knowledge of use of DME or AE   OT Treatment/Interventions:      OT Goals(Current goals can be found in the care plan section) Acute Rehab OT Goals Patient Stated Goal: Go to rehab before returning home  OT Frequency:  End of Session Equipment Utilized During Treatment: Gait belt;Rolling walker Nurse Communication: Patient requests pain meds  Activity Tolerance: Patient limited by pain Patient left: in bed;with call bell/phone within reach;with family/visitor present   Time: 1338-1401 OT Time Calculation (min): 23 min Charges:  OT Evaluation $Initial OT Evaluation Tier I: 1 Procedure OT Treatments $Self Care/Home Management : 8-22 mins  Almon Register [phone] 02/10/2014, 2:39 PM

## 2014-02-10 NOTE — Progress Notes (Signed)
Patient requests to speak with social worker about other possibilities for placement at discharge. Patient states she is reconsidering if she wants to go to [facility].

## 2014-02-10 NOTE — Progress Notes (Signed)
Clinical Social Work Department CLINICAL SOCIAL WORK PLACEMENT NOTE 02/10/2014  Patient:  [name], [name]  Account Number:  [id number] Admit date:  02/09/2014  Clinical Social Worker:  Blima Rich, Latanya Presser  Date/time:  02/10/2014 05:51 PM  Clinical Social Work is seeking post-discharge placement for this patient at the following level of care:   SKILLED NURSING   (*CSW will update this form in Epic as items are completed)   02/10/2014  Patient/family provided with [facility] Department of Clinical Social Work's list of facilities offering this level of care within the geographic area requested by the patient (or if unable, by the patient's family).  02/10/2014  Patient/family informed of their freedom to choose among providers that offer the needed level of care, that participate in Medicare, Medicaid or managed care program needed by the patient, have an available bed and are willing to accept the patient.  02/10/2014  Patient/family informed of [facility]' ownership interest in [facility], as well as of the fact that they are under no obligation to receive care at this facility.  PASARR submitted to EDS on 02/10/2014 PASARR number received on 02/10/2014  FL2 transmitted to all facilities in geographic area requested by pt/family on  02/10/2014 FL2 transmitted to all facilities within larger geographic area on   Patient informed that his/her managed care company has contracts with or will negotiate with  certain facilities, including the following:     Patient/family informed of bed offers received:   Patient chooses bed at  Physician recommends and patient chooses bed at    Patient to be transferred to  on   Patient to be transferred to facility by  Patient and family notified of transfer on  Name of family member notified:    The following physician request were entered in Epic:   Additional Comments:

## 2014-02-10 NOTE — Progress Notes (Signed)
Physical Therapy Treatment Patient Details Name: Joby Hershkowitz MRN: [id number] DOB: 11-12-1950 Today's Date: 02/10/2014    History of Present Illness 63 y.o. female s/p left TKA. Hx of HTN, asthma, GERD,  Diabetes, fibromyalgia, and cancer.    PT Comments    Pt declining mobility today, but was agreeable to there ex in bed.  Pt stating her sciatic pain is to much to amb and that she has already walked to bathroom x2 today.  Needs max encouragement.  Will continue to follow.    Follow Up Recommendations  SNF     Equipment Recommendations  Rolling walker with 5" wheels;3in1 (PT)    Recommendations for Other Services       Precautions / Restrictions Precautions Precautions: Knee Required Braces or Orthoses: Knee Immobilizer - Left Knee Immobilizer - Left: On when out of bed or walking;Discontinue once straight leg raise with < 10 degree lag Restrictions Weight Bearing Restrictions: Yes LLE Weight Bearing: Weight bearing as tolerated    Mobility  Bed Mobility Overal bed mobility: Needs Assistance Bed Mobility: Supine to Sit;Sit to Supine     Supine to sit: Min assist;HOB elevated Sit to supine: Min assist   General bed mobility comments: A for LLE and for safe hand placement  Transfers Overall transfer level: Needs assistance Equipment used: Rolling walker (2 wheeled) Transfers: Sit to/from Stand Sit to Stand: Min assist         General transfer comment: VCs for safe hand placement  Ambulation/Gait                 Stairs            Wheelchair Mobility    Modified Rankin (Stroke Patients Only)       Balance Overall balance assessment: Needs assistance Sitting-balance support: Feet supported;Single extremity supported Sitting balance-Leahy Scale: Poor (due to sciatica pain)     Standing balance support: Bilateral upper extremity supported Standing balance-Leahy Scale: Poor                      Cognition Arousal/Alertness:  Awake/alert Behavior During Therapy: WFL for tasks assessed/performed Overall Cognitive Status: Within Functional Limits for tasks assessed                      Exercises Total Joint Exercises Quad Sets: AROM;Left;10 reps;Supine Heel Slides: AAROM;Left;10 reps Hip ABduction/ADduction: AAROM;Left;10 reps Straight Leg Raises: AAROM;Left;10 reps    General Comments        Pertinent Vitals/Pain 8/10.  Premedicated.      Home Living Family/patient expects to be discharged to:: Skilled nursing facility                    Prior Function            PT Goals (current goals can now be found in the care plan section) Acute Rehab PT Goals Patient Stated Goal: Go to rehab before returning home Time For Goal Achievement: 02/16/14 Potential to Achieve Goals: Good Progress towards PT goals: Progressing toward goals    Frequency  7X/week    PT Plan Current plan remains appropriate    Co-evaluation             End of Session   Activity Tolerance: Patient limited by pain Patient left: in bed;with call bell/phone within reach;with family/visitor present     Time: 2836-6294 PT Time Calculation (min): 26 min  Charges:  $Therapeutic Exercise: 23-37 mins  G CodesCatarina Hartshorn, [place] 02/10/2014, 3:12 PM

## 2014-02-11 LAB — GLUCOSE, CAPILLARY
GLUCOSE-CAPILLARY: 103 mg/dL — AB (ref 70–99)
GLUCOSE-CAPILLARY: 107 mg/dL — AB (ref 70–99)
Glucose-Capillary: 115 mg/dL — ABNORMAL HIGH (ref 70–99)
Glucose-Capillary: 146 mg/dL — ABNORMAL HIGH (ref 70–99)

## 2014-02-11 LAB — CBC
HCT: 30.4 % — ABNORMAL LOW (ref 36.0–46.0)
HEMOGLOBIN: 9.5 g/dL — AB (ref 12.0–15.0)
MCH: 28.1 pg (ref 26.0–34.0)
MCHC: 31.3 g/dL (ref 30.0–36.0)
MCV: 89.9 fL (ref 78.0–100.0)
Platelets: 269 10*3/uL (ref 150–400)
RBC: 3.38 MIL/uL — ABNORMAL LOW (ref 3.87–5.11)
RDW: 14.6 % (ref 11.5–15.5)
WBC: 12.3 10*3/uL — ABNORMAL HIGH (ref 4.0–10.5)

## 2014-02-11 NOTE — Progress Notes (Signed)
Physical Therapy Treatment Patient Details Name: [name] MRN: [id number] DOB: 1950-12-10 Today's Date: 02/11/2014    History of Present Illness 63 y.o. female s/p left TKA. Hx of HTN, asthma, GERD,  Diabetes, fibromyalgia, and cancer.    PT Comments    Pt was able to progress mobility minimally today with max encouragement. Continues to be limited due to pain in Lt knee and lt hip. Cont to recommend SNF for post acute rehab. Will follow per POC.  Follow Up Recommendations  SNF     Equipment Recommendations  Rolling walker with 5" wheels;3in1 (PT)    Recommendations for Other Services       Precautions / Restrictions Precautions Precautions: Knee Precaution Comments: Reviewed knee precautions. Required Braces or Orthoses: Knee Immobilizer - Left Knee Immobilizer - Left: On when out of bed or walking;Discontinue once straight leg raise with < 10 degree lag Restrictions Weight Bearing Restrictions: Yes LLE Weight Bearing: Weight bearing as tolerated    Mobility  Bed Mobility Overal bed mobility: Needs Assistance Bed Mobility: Supine to Sit;Sit to Supine     Supine to sit: Min assist;HOB elevated Sit to supine: Min assist   General bed mobility comments: (A) to advance Lt LE into/out of bed; cues for hand placement   Transfers Overall transfer level: Needs assistance Equipment used: Rolling walker (2 wheeled) Transfers: Sit to/from Stand Sit to Stand: Min guard         General transfer comment: min guard to steady; primarily when transitioning UEs to from RW for balance; cues for safety and hand placement  Ambulation/Gait Ambulation/Gait assistance: Min assist Ambulation Distance (Feet): 20 Feet Assistive device: Rolling walker (2 wheeled) Gait Pattern/deviations: Decreased step length - right;Decreased stance time - left;Antalgic;Shuffle;Narrow base of support;Trunk flexed Gait velocity: very decreased Gait velocity interpretation: Below normal speed for  age/gender General Gait Details: pt greatly limited due to pain and fatigue; cues for gt sequencing to increase to step through gt; cues for upright posture; (A) to maintain balance and manage RW   Stairs            Wheelchair Mobility    Modified Rankin (Stroke Patients Only)       Balance Overall balance assessment: Needs assistance Sitting-balance support: Feet supported;No upper extremity supported Sitting balance-Leahy Scale: Good     Standing balance support: During functional activity;Bilateral upper extremity supported Standing balance-Leahy Scale: Poor Standing balance comment: relies heavily on UE support                    Cognition Arousal/Alertness: Awake/alert Behavior During Therapy: WFL for tasks assessed/performed Overall Cognitive Status: Within Functional Limits for tasks assessed                      Exercises Total Joint Exercises Ankle Circles/Pumps: AROM;Both;10 reps;Supine Quad Sets: AROM;Left;10 reps;Supine Heel Slides: AAROM;Left;10 reps;Seated Hip ABduction/ADduction: AAROM;Left;10 reps;Supine Long Arc Quad: AAROM;Left;10 reps;Seated Goniometric ROM: AAROM in sitting flexion to 55 degrees; limited by pain    General Comments        Pertinent Vitals/Pain 6/10; patient repositioned for comfort     Home Living                      Prior Function            PT Goals (current goals can now be found in the care plan section) Acute Rehab PT Goals Patient Stated Goal: Go to rehab before returning home  PT Goal Formulation: With patient Time For Goal Achievement: 02/16/14 Potential to Achieve Goals: Good Progress towards PT goals: Progressing toward goals    Frequency  7X/week    PT Plan Current plan remains appropriate    Co-evaluation             End of Session Equipment Utilized During Treatment: Left knee immobilizer;Gait belt Activity Tolerance: Patient limited by pain Patient left: in  bed;with call bell/phone within reach;with family/visitor present     Time: 9030-0923 PT Time Calculation (min): 24 min  Charges:  $Gait Training: 8-22 mins $Therapeutic Exercise: 8-22 mins                    G CodesGustavus Bryant, [place] 02/11/2014, 3:09 PM

## 2014-02-11 NOTE — Progress Notes (Signed)
PT Cancellation Note  Patient Details Name: [name] MRN: [id number] DOB: 1951/05/08   Cancelled Treatment:    Reason Eval/Treat Not Completed: Pain limiting ability to participate.  Pt states too painful from sciatica, fibromyalgia, and L TKA.  Will try to f/u later in day.     [name], [place] 02/11/2014, 10:26 AM

## 2014-02-11 NOTE — Progress Notes (Signed)
Subjective: 2 Days Post-Op Procedure(s) (LRB): LEFT TOTAL KNEE ARTHROPLASTY (Left) Patient reports pain as severe.    Objective: Vital signs in last 24 hours: Temp:  [97.7 F (36.5 C)-98.3 F (36.8 C)] 98.3 F (36.8 C) (07/05 0513) Pulse Rate:  [76-104] 87 (07/05 0513) Resp:  [18-20] 20 (07/05 0513) BP: (112-130)/(51-63) 112/51 mmHg (07/05 0513) SpO2:  [94 %-96 %] 96 % (07/05 0513)  Intake/Output from previous day: 07/04 0701 - 07/05 0700 In: 1160 [P.O.:1160] Out: 460 [Urine:450; Drains:10] Intake/Output this shift: Total I/O In: 240 [P.O.:240] Out: -    Recent Labs  02/10/14 0428 02/11/14 0355  HGB 9.6* 9.5*    Recent Labs  02/10/14 0428 02/11/14 0355  WBC 9.7 12.3*  RBC 3.36* 3.38*  HCT 30.0* 30.4*  PLT 215 269    Recent Labs  02/10/14 0428  NA 141  K 4.4  CL 102  CO2 28  BUN 12  CREATININE 0.66  GLUCOSE 135*  CALCIUM 9.0   No results found for this basename: LABPT, INR,  in the last 72 hours  Neurovascular intact Sensation intact distally Intact pulses distally Incision: scant drainage Compartment soft  Assessment/Plan: 2 Days Post-Op Procedure(s) (LRB): LEFT TOTAL KNEE ARTHROPLASTY (Left) Up with therapy Discharge to SNF probably tomorrow   Erskine Emery 02/11/2014, 8:40 AM

## 2014-02-11 NOTE — Care Management Note (Signed)
    Page 1 of 1   02/11/2014     10:48:02 AM CARE MANAGEMENT NOTE 02/11/2014  Patient:  KATRISHA, SEGALL   Account Number:  [id number]  Date Initiated:  02/09/2014  Documentation initiated by:  Ricki Miller  Subjective/Objective Assessment:   63 yr old female s/p left total knee arthroplasty.     Action/Plan:   PT/OT Eval  Case manager will follow.Patient preoperatively setup with [facility]. Patient's family states she will go to [facility] [place]. Social worker notified.   Anticipated DC Date:  02/12/2014   Anticipated DC Plan:  [facility]  CM consult      [facility] Choice  HOME HEALTH   Choice offered to / List presented to:             Status of service:  Completed, signed off Medicare Important Message given?   (If response is "NO", the following Medicare IM given date fields will be blank) Date Medicare IM given:   Medicare IM given by:   Date Additional Medicare IM given:   Additional Medicare IM given by:    Discharge Disposition:  [facility]  Per UR Regulation:  Reviewed for med. necessity/level of care/duration of stay  If discussed at [place] of Stay Meetings, dates discussed:    Comments:  02/11/14 10:40 CM received call from [place], [place], stating pt was going to [facility] but had not heard anything on this paln.  CM called CSW, Mel Almond who stated SNF was worked up yesterday.  CM signing off as CSW arranging SNF. Mariane Masters, BSN, CM [phone].

## 2014-02-12 DIAGNOSIS — I82409 Acute embolism and thrombosis of unspecified deep veins of unspecified lower extremity: Secondary | ICD-10-CM

## 2014-02-12 DIAGNOSIS — M79609 Pain in unspecified limb: Secondary | ICD-10-CM

## 2014-02-12 DIAGNOSIS — Z96659 Presence of unspecified artificial knee joint: Secondary | ICD-10-CM

## 2014-02-12 DIAGNOSIS — M1710 Unilateral primary osteoarthritis, unspecified knee: Secondary | ICD-10-CM

## 2014-02-12 DIAGNOSIS — I10 Essential (primary) hypertension: Secondary | ICD-10-CM | POA: Diagnosis present

## 2014-02-12 DIAGNOSIS — K219 Gastro-esophageal reflux disease without esophagitis: Secondary | ICD-10-CM | POA: Diagnosis present

## 2014-02-12 DIAGNOSIS — IMO0002 Reserved for concepts with insufficient information to code with codable children: Secondary | ICD-10-CM

## 2014-02-12 LAB — GLUCOSE, CAPILLARY
Glucose-Capillary: 115 mg/dL — ABNORMAL HIGH (ref 70–99)
Glucose-Capillary: 108 mg/dL — ABNORMAL HIGH (ref 70–99)
Glucose-Capillary: 112 mg/dL — ABNORMAL HIGH (ref 70–99)
Glucose-Capillary: 135 mg/dL — ABNORMAL HIGH (ref 70–99)
Glucose-Capillary: 131 mg/dL — ABNORMAL HIGH (ref 70–99)

## 2014-02-12 LAB — CBC
HEMATOCRIT: 26 % — AB (ref 36.0–46.0)
Hemoglobin: 8.4 g/dL — ABNORMAL LOW (ref 12.0–15.0)
MCH: 28.6 pg (ref 26.0–34.0)
MCHC: 32.3 g/dL (ref 30.0–36.0)
MCV: 88.4 fL (ref 78.0–100.0)
Platelets: 207 10*3/uL (ref 150–400)
RBC: 2.94 MIL/uL — ABNORMAL LOW (ref 3.87–5.11)
RDW: 14.6 % (ref 11.5–15.5)
WBC: 7 10*3/uL (ref 4.0–10.5)

## 2014-02-12 MED ORDER — RIVAROXABAN 15 MG PO TABS
15.0000 mg | ORAL_TABLET | Freq: Two times a day (BID) | ORAL | Status: DC
  Administered 2014-02-12 – 2014-02-13 (×2): 15 mg via ORAL
  Filled 2014-02-12 (×4): qty 1

## 2014-02-12 MED ORDER — FERROUS SULFATE 325 (65 FE) MG PO TABS
325.0000 mg | ORAL_TABLET | Freq: Three times a day (TID) | ORAL | Status: DC
  Administered 2014-02-12 – 2014-02-13 (×4): 325 mg via ORAL
  Filled 2014-02-12 (×7): qty 1

## 2014-02-12 NOTE — Progress Notes (Signed)
Bilateral lower extremity venous duplex.  Right:  No evidence of DVT, superficial thrombosis, or Baker's cyst.  Left: DVT noted in the posterior tibial and peroneal veins.  No evidence of superficial thrombosis.  No Baker's cyst.

## 2014-02-12 NOTE — Progress Notes (Signed)
CSW spoke to patient's daughter over the phone to inform of dc likely Tuesday to [facility]. Per daughter, she would like to take patient by car. CSW continues to follow for dc.  Jeanette Caprice, MSW, [place]

## 2014-02-12 NOTE — Progress Notes (Signed)
ANTICOAGULATION CONSULT NOTE - Initial Consult  Pharmacy Consult for Xarelto Indication: DVT  Allergies  Allergen Reactions  . Morphine And Related     Felt like "burning up"  . Tramadol     dizzy    Patient Measurements: Height: 5\' 2"  (157.5 cm) Weight: 196 lb (88.905 kg) IBW/kg (Calculated) : 50.1 Vital Signs: Temp: 98.1 F (36.7 C) (07/06 1321) Temp src: Oral (07/06 0628) BP: 114/59 mmHg (07/06 1321) Pulse Rate: 88 (07/06 1321) Labs:  Recent Labs  02/10/14 0428 02/11/14 0355 02/12/14 0440  HGB 9.6* 9.5* 8.4*  HCT 30.0* 30.4* 26.0*  PLT 215 269 207  CREATININE 0.66  --   --    Estimated Creatinine Clearance: 76.5 ml/min (by C-G formula based on Cr of 0.66).  Medical History: Past Medical History  Diagnosis Date  . Asthma   . Hypertension   . Diabetes mellitus without complication   . Elevated cholesterol   . GERD (gastroesophageal reflux disease)   . Arthritis   . Fibromyalgia   . Cancer   . Rectal cancer 2003   Assessment: 73 YOF post-op left - TKA day#3 with new left leg DVT to start Xarelto for VTE treatment. Current CrCl ~ 76.57mL/min. Hemoglobin in low at 8.6. Platelets are within normal limits. Patient has only been on SCDs - no pharmacologic prophylaxis noted post-operatively. No overt bleeding noted.   Goal of Therapy:  Monitor platelets by anticoagulation protocol: Yes   Plan:  Xarelto 15mg  po BID with meals x21 days, then decrease to 20mg  po daily with evening meal.   Sloan Leiter, PharmD, BCPS Clinical Pharmacist [phone] 02/12/2014,3:58 PM

## 2014-02-12 NOTE — Consult Note (Signed)
Patient Demographics  [name], is a 63 y.o. female   MRN: [id number]   DOB - [birth year]  Admit Date - 02/09/2014    Outpatient Primary MD for the patient is [name], MD  Consult requested in the Hospital by Mcarthur Rossetti, *, On 02/12/2014    Reason for consult left leg DVT   With History of -  Past Medical History  Diagnosis Date  . Asthma   . Hypertension   . Diabetes mellitus without complication   . Elevated cholesterol   . GERD (gastroesophageal reflux disease)   . Arthritis   . Fibromyalgia   . Cancer   . Rectal cancer 2003      Past Surgical History  Procedure Laterality Date  . Colon resection  2013  . Abdominal hysterectomy    . Breast cyst excision    . Insertion of mesh      Abdominal mesh placed after chemotherapy    in for   Elective left total knee replacement   HPI  [name]  is a 63 y.o. female,  with history of fibromyalgia, hypertension, GERD, dyslipidemia, type 2 diabetes mellitus who was admitted the hospital by orthopedics team for left total knee replacement, underwent procedure well a few days ago, today she started complaining of left leg pain and discomfort, left leg ultrasound was obtained which confirmed DVT noted in the posterior tibial and peroneal veins, hospitalist he was consulted for management of the same.   Patient decides left leg pain and discomfort has no subjective complaints at this time.     Review of Systems    In addition to the HPI above,   No Fever-chills, No Headache, No changes with Vision or hearing, No problems swallowing food or Liquids, No Chest pain, Cough or Shortness of Breath, No Abdominal pain, No Nausea or Vommitting, Bowel movements are regular, No Blood in stool or Urine, No dysuria, No new skin rashes or bruises, No  new joints pains-aches,  except left knee pain and some left calf discomfort No new weakness, tingling, numbness in any extremity, No recent weight gain or loss, No polyuria, polydypsia or polyphagia, No significant Mental Stressors.  A full 10 point Review of Systems was done, except as stated above, all other Review of Systems were negative.   Social History History  Substance Use Topics  . Smoking status: Never Smoker   . Smokeless tobacco: Not on file  . Alcohol Use: No     Family History No history of DVT PE  Prior to Admission medications   Medication Sig Start Date End Date Taking? Authorizing Provider  HYDROcodone-acetaminophen (NORCO/VICODIN) 5-325 MG per tablet Take 1-2 tablets by mouth every 8 (eight) hours as needed for moderate pain.   Yes Historical Provider, MD  lisinopril-hydrochlorothiazide (PRINZIDE,ZESTORETIC) 20-25 MG per tablet Take 1 tablet by mouth daily.   Yes Historical Provider, MD  metFORMIN (GLUCOPHAGE) 500 MG tablet Take 500 mg by mouth daily after supper.   Yes Historical  Provider, MD  pravastatin (PRAVACHOL) 20 MG tablet Take 20 mg by mouth at bedtime.    Yes Historical Provider, MD  ranitidine (ZANTAC) 300 MG tablet Take 300 mg by mouth daily as needed for heartburn.   Yes Historical Provider, MD    Anti-infectives   Start     Dose/Rate Route Frequency Ordered Stop   02/09/14 1330  ceFAZolin (ANCEF) IVPB 1 g/50 mL premix     1 g 100 mL/hr over 30 Minutes Intravenous Every 6 hours 02/09/14 1130 02/09/14 1923   02/09/14 0600  ceFAZolin (ANCEF) IVPB 2 g/50 mL premix     2 g 100 mL/hr over 30 Minutes Intravenous On call to O.R. 02/08/14 1750 02/09/14 0730      Scheduled Meds: . docusate sodium  100 mg Oral BID  . ferrous sulfate  325 mg Oral TID WC  . hydrochlorothiazide  25 mg Oral Daily  . insulin aspart  0-15 Units Subcutaneous TID WC  . insulin aspart  0-5 Units Subcutaneous QHS  . lisinopril  20 mg Oral Daily  . metFORMIN  500 mg Oral  QPC supper  . pantoprazole  40 mg Oral Daily  . Rivaroxaban  15 mg Oral BID WC  . simvastatin  10 mg Oral q1800   Continuous Infusions: . sodium chloride 75 mL/hr at 02/10/14 0808   PRN Meds:.acetaminophen, acetaminophen, alum & mag hydroxide-simeth, diazepam, diphenhydrAMINE, HYDROmorphone (DILAUDID) injection, menthol-cetylpyridinium, methocarbamol (ROBAXIN) IV, methocarbamol, metoCLOPramide (REGLAN) injection, metoCLOPramide, ondansetron (ZOFRAN) IV, ondansetron, oxyCODONE, phenol, polyethylene glycol, zolpidem  Allergies  Allergen Reactions  . Morphine And Related     Felt like "burning up"  . Tramadol     dizzy    Physical Exam  Vitals  Blood pressure 114/59, pulse 88, temperature 98.1 F (36.7 C), temperature source Oral, resp. rate 18, height 5\' 2"  (1.575 m), weight 88.905 kg (196 lb), SpO2 99.00%.   1. General middle-aged Caucasian female lying in bed in NAD,     2. Normal affect and insight, Not Suicidal or Homicidal, Awake Alert, Oriented X 3.  3. No F.N deficits, ALL C.Nerves Intact, Strength 5/5 all 4 extremities, Sensation intact all 4 extremities, Plantars down going.  4. Ears and Eyes appear Normal, Conjunctivae clear, PERRLA. Moist Oral Mucosa.  5. Supple Neck, No JVD, No cervical lymphadenopathy appriciated, No Carotid Bruits.  6. Symmetrical Chest wall movement, Good air movement bilaterally, CTAB.  7. RRR, No Gallops, Rubs or Murmurs, No Parasternal Heave.  8. Positive Bowel Sounds, Abdomen Soft, No tenderness, No organomegaly appriciated,No rebound -guarding or rigidity.  9.  No Cyanosis, Normal Skin Turgor, No Skin Rash or Bruise.  10. Good muscle tone,  joints appear normal , no effusions, Normal ROM. Left calf mildly swollen.  11. No Palpable Lymph Nodes in Neck or Axillae     Data Review  CBC  Recent Labs Lab 02/10/14 0428 02/11/14 0355 02/12/14 0440  WBC 9.7 12.3* 7.0  HGB 9.6* 9.5* 8.4*  HCT 30.0* 30.4* 26.0*  PLT 215 269 207    MCV 89.3 89.9 88.4  MCH 28.6 28.1 28.6  MCHC 32.0 31.3 32.3  RDW 13.9 14.6 14.6   ------------------------------------------------------------------------------------------------------------------  Chemistries   Recent Labs Lab 02/10/14 0428  NA 141  K 4.4  CL 102  CO2 28  GLUCOSE 135*  BUN 12  CREATININE 0.66  CALCIUM 9.0   ------------------------------------------------------------------------------------------------------------------ estimated creatinine clearance is 76.5 ml/min (by C-G formula based on Cr of 0.66). ------------------------------------------------------------------------------------------------------------------ No results found for this basename:  TSH, T4TOTAL, FREET3, T3FREE, THYROIDAB,  in the last 72 hours   Coagulation profile No results found for this basename: INR, PROTIME,  in the last 168 hours ------------------------------------------------------------------------------------------------------------------- No results found for this basename: DDIMER,  in the last 72 hours -------------------------------------------------------------------------------------------------------------------  Cardiac Enzymes No results found for this basename: CK, CKMB, TROPONINI, MYOGLOBIN,  in the last 168 hours ------------------------------------------------------------------------------------------------------------------ No components found with this basename: POCBNP,   CBG (last 3)   Recent Labs  02/11/14 2152 02/12/14 0629 02/12/14 1103  GLUCAP 146* 108* 112*     ---------------------------------------------------------------------------------------------------------------  Urinalysis    Component Value Date/Time   COLORURINE YELLOW 01/29/2014 0858   APPEARANCEUR CLOUDY* 01/29/2014 0858   LABSPEC 1.019 01/29/2014 0858   PHURINE 6.0 01/29/2014 0858   GLUCOSEU NEGATIVE 01/29/2014 0858   HGBUR NEGATIVE 01/29/2014 [place]  01/29/2014 0858   KETONESUR NEGATIVE 01/29/2014 0858   PROTEINUR NEGATIVE 01/29/2014 0858   UROBILINOGEN 0.2 01/29/2014 0858   NITRITE NEGATIVE 01/29/2014 0858   LEUKOCYTESUR SMALL* 01/29/2014 0858     Imaging results:   Dg Knee Left Port  02/09/2014   CLINICAL DATA:  Postop left knee  EXAM: PORTABLE LEFT KNEE - 1-2 VIEW  COMPARISON:  None.  FINDINGS: The left knee demonstrates a total knee arthroplasty without evidence of hardware failure complication. There is no significant joint effusion. There is no fracture or dislocation. The alignment is anatomic. Surgical drains are present. Post-surgical changes noted in the surrounding soft tissues.  IMPRESSION: Status post left total knee arthroplasty.   Electronically Signed   By: Kathreen Devoid   On: 02/09/2014 10:10        Assessment & Plan   1. 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 This is clearly provoked, will be placed on xaralto, pharmacy to dose, no signs of PE. Duration at least 6 months then per PCP.    2. Left total knee replacement. Will defer management of cream which is orthopedics.    3. Hypertension. Continue present dose lisinopril but pressure is stable.    4. DM type II. On sliding scale glycemic control appropriate     5. Fibromyalgia. Continue home medications no changes.     6.GERD - on PPI    AM Labs Ordered, also please review Full Orders  Family Communication: Plan discussed with patient     Thank you for the consult, we will follow the patient with you in the Hospital.   Lala Lund K M.D on 02/12/2014 at 3:51 PM  Between 7am to 7pm - Pager - [phone]  After 7pm go to [email or web address] - password [email or web address]  And look for the night coverage person covering me after hours   Thank you for the consult, we will follow the patient with you in the [facility] Office  [phone]   **Disclaimer: This note may have been dictated with voice recognition software.  Similar sounding words can inadvertently be transcribed and this note may contain transcription errors which may not have been corrected upon publication of note.**

## 2014-02-12 NOTE — Progress Notes (Signed)
PT Cancellation Note  Patient Details Name: [name] MRN: [id number] DOB: November 05, 1950   Cancelled Treatment:    Reason Eval/Treat Not Completed: Medical issues which prohibited therapy.  Awaiting dopplers to r/o DVT.  Will f/u another time.     Aldine Grainger, Thornton Papas 02/12/2014, 9:49 AM

## 2014-02-12 NOTE — Progress Notes (Signed)
Subjective: 3 Days Post-Op Procedure(s) (LRB): LEFT TOTAL KNEE ARTHROPLASTY (Left) Patient reports pain as moderate.   Complaining of calf pain left leg. Objective: Vital signs in last 24 hours: Temp:  [97.5 F (36.4 C)-98.5 F (36.9 C)] 97.5 F (36.4 C) (07/06 0628) Pulse Rate:  [75-89] 75 (07/06 0628) Resp:  [18] 18 (07/06 0628) BP: (102-109)/(40-56) 102/40 mmHg (07/06 0628) SpO2:  [94 %-99 %] 97 % (07/06 0628)  Intake/Output from previous day: 07/05 0701 - 07/06 0700 In: 880 [P.O.:880] Out: 2 [Urine:2] Intake/Output this shift:     Recent Labs  02/10/14 0428 02/11/14 0355 02/12/14 0440  HGB 9.6* 9.5* 8.4*    Recent Labs  02/11/14 0355 02/12/14 0440  WBC 12.3* 7.0  RBC 3.38* 2.94*  HCT 30.4* 26.0*  PLT 269 207    Recent Labs  02/10/14 0428  NA 141  K 4.4  CL 102  CO2 28  BUN 12  CREATININE 0.66  GLUCOSE 135*  CALCIUM 9.0   No results found for this basename: LABPT, INR,  in the last 72 hours  Sensation intact distally Intact pulses distally Dorsiflexion/Plantar flexion intact Incision: dressing C/D/I Left calf soft yet very tender  Assessment/Plan: 3 Days Post-Op Procedure(s) (LRB): LEFT TOTAL KNEE ARTHROPLASTY (Left) Up with therapy Doppler r/o DVT SNF placement most likely Tuesday.  Erskine Emery 02/12/2014, 7:57 AM

## 2014-02-12 NOTE — Progress Notes (Signed)
Patient ID: [name], female   DOB: Apr 28, 1951, 63 y.o.   MRN: [id number] Patient on Xarelto now to treat acute DVT.  She did report calf pain this am to [place] for the first time since surgery.  A doppler obtained today showed the DVT.  The patient had been having decreased mobility for several weeks prior to surgery due to the severity of her pain she now reports.  She even states that she saw her PCP last week and had very tender calf pain in her leg then, and that is how it felt today.  She was on 325 mg aspirin twice daily and SCD's post-op.  She will be able to go the SNF for rehab tomorrow 7/7.

## 2014-02-12 NOTE — Progress Notes (Signed)
PT Cancellation Note  Patient Details Name: [name] MRN: [id number] DOB: 1951-02-13   Cancelled Treatment:    Reason Eval/Treat Not Completed: Medical issues which prohibited therapy.  L LE DVT noted on dopplers.  Will hold PT at this time.  Will f/u another time.     [name], [place] 02/12/2014, 1:39 PM

## 2014-02-13 ENCOUNTER — Encounter (HOSPITAL_COMMUNITY): Payer: Self-pay | Admitting: Orthopaedic Surgery

## 2014-02-13 LAB — GLUCOSE, CAPILLARY
GLUCOSE-CAPILLARY: 123 mg/dL — AB (ref 70–99)
Glucose-Capillary: 126 mg/dL — ABNORMAL HIGH (ref 70–99)

## 2014-02-13 MED ORDER — RIVAROXABAN 15 MG PO TABS: 15.0000 mg | ORAL_TABLET | Freq: Two times a day (BID) | ORAL | Status: AC

## 2014-02-13 MED ORDER — OXYCODONE-ACETAMINOPHEN 5-325 MG PO TABS: 1.0000 | ORAL_TABLET | ORAL | Status: AC | PRN

## 2014-02-13 MED ORDER — FERROUS SULFATE 325 (65 FE) MG PO TABS: 325.0000 mg | ORAL_TABLET | Freq: Three times a day (TID) | ORAL | Status: AC

## 2014-02-13 MED ORDER — RIVAROXABAN 15 MG PO TABS: 15.0000 mg | ORAL_TABLET | Freq: Two times a day (BID) | ORAL | Status: DC

## 2014-02-13 NOTE — Discharge Instructions (Signed)
Information on my medicine - XARELTO (rivaroxaban)  This medication education was reviewed with me or my healthcare representative as part of my discharge preparation.  The pharmacist that spoke with me during my hospital stay was:  Apolo Cutshaw, Alex Gardener, [place]? Xarelto was prescribed to treat blood clots that may have been found in the veins of your legs (deep vein thrombosis) or in your lungs (pulmonary embolism) and to reduce the risk of them occurring again.  What do you need to know about Xarelto? The starting dose is one 15 mg tablet taken TWICE daily with food for the FIRST 21 DAYS then on 7/28  the dose is changed to one 20 mg tablet taken ONCE A DAY with your evening meal.  DO NOT stop taking Xarelto without talking to the health care provider who prescribed the medication.  Refill your prescription for 20 mg tablets before you run out.  After discharge, you should have regular check-up appointments with your healthcare provider that is prescribing your Xarelto.  In the future your dose may need to be changed if your kidney function changes by a significant amount.  What do you do if you miss a dose? If you are taking Xarelto TWICE DAILY and you miss a dose, take it as soon as you remember. You may take two 15 mg tablets (total 30 mg) at the same time then resume your regularly scheduled 15 mg twice daily the next day.  If you are taking Xarelto ONCE DAILY and you miss a dose, take it as soon as you remember on the same day then continue your regularly scheduled once daily regimen the next day. Do not take two doses of Xarelto at the same time.   Important Safety Information Xarelto is a blood thinner medicine that can cause bleeding. You should call your healthcare provider right away if you experience any of the following:   Bleeding from an injury or your nose that does not stop.   Unusual colored urine (red or dark brown) or unusual colored  stools (red or black).   Unusual bruising for unknown reasons.   A serious fall or if you hit your head (even if there is no bleeding).  Some medicines may interact with Xarelto and might increase your risk of bleeding while on Xarelto. To help avoid this, consult your healthcare provider or pharmacist prior to using any new prescription or non-prescription medications, including herbals, vitamins, non-steroidal anti-inflammatory drugs (NSAIDs) and supplements.  This website has more information on Xarelto: [email or web address]

## 2014-02-13 NOTE — Progress Notes (Signed)
Clinical Social Work Department CLINICAL SOCIAL WORK PLACEMENT NOTE 02/13/2014  Patient:  [name], [name]  Account Number:  [id number] Admit date:  02/09/2014  Clinical Social Worker:  Blima Rich, Latanya Presser  Date/time:  02/10/2014 05:51 PM  Clinical Social Work is seeking post-discharge placement for this patient at the following level of care:   SKILLED NURSING   (*CSW will update this form in Epic as items are completed)   02/10/2014  Patient/family provided with [facility] Department of Clinical Social Work's list of facilities offering this level of care within the geographic area requested by the patient (or if unable, by the patient's family).  02/10/2014  Patient/family informed of their freedom to choose among providers that offer the needed level of care, that participate in Medicare, Medicaid or managed care program needed by the patient, have an available bed and are willing to accept the patient.  02/10/2014  Patient/family informed of [facility]' ownership interest in [facility], as well as of the fact that they are under no obligation to receive care at this facility.  PASARR submitted to EDS on 02/10/2014 PASARR number received on 02/10/2014  FL2 transmitted to all facilities in geographic area requested by pt/family on  02/10/2014 FL2 transmitted to all facilities within larger geographic area on   Patient informed that his/her managed care company has contracts with or will negotiate with  certain facilities, including the following:     Patient/family informed of bed offers received:  02/12/2014 Patient chooses bed at [facility] [place] Physician recommends and patient chooses bed at    Patient to be transferred to [facility] on  02/13/2014 Patient to be transferred to facility by EMS Patient and family notified of transfer on 02/13/2014 Name of family member notified:  DAUGHTER  The following physician  request were entered in Epic:   Additional Comments:  Jeanette Caprice, MSW, [place]

## 2014-02-13 NOTE — Progress Notes (Signed)
Clinical Social Worker facilitated patient discharge including contacting patient, family (left voicemail for patient's daughter) and facility to confirm patient discharge plans.  Patient states she already talked to her daughter to inform her of dc today. Clinical information faxed to facility and patient agreeable with plan.  CSW arranged ambulance transport via [facility] to [place] for 1pm.  RN to call report prior to discharge.  Clinical Social Worker will sign off for now as social work intervention is no longer needed. Please consult [place] again if new need arises.  Jeanette Caprice, MSW, [place]

## 2014-02-13 NOTE — Progress Notes (Signed)
Report called to [facility]. 

## 2014-02-13 NOTE — Progress Notes (Signed)
Patient Demographics  [name], is a 63 y.o. female, DOB - Oct 26, 1950, BUL:[id number]  Admit date - 02/09/2014   Admitting Physician Mcarthur Rossetti, MD  Outpatient Primary MD for the patient is Carla Cerise, MD  LOS - 4   No chief complaint on file.          Subjective:   [name] today has, No headache, No chest pain, No abdominal pain - No Nausea, No new weakness tingling or numbness, No Cough - No SOB.     Assessment & Plan   1. 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 This is clearly provoked, will be placed on xaralto, pharmacy to dose, no signs of PE. Duration at least 6 months then per PCP. No chest pain or shortness of breath.   2. Left total knee replacement. Will defer management of cream which is orthopedics.    3. Hypertension. Continue present dose lisinopril but pressure is stable.    4. DM type II. On Glucophage + sliding scale glycemic control appropriate   CBG (last 3)   Recent Labs  02/12/14 1601 02/12/14 2246 02/13/14 0701  GLUCAP 135* 131* 126*     5. Fibromyalgia. Continue home medications no changes.    6.GERD - on PPI     Medications  Scheduled Meds: . docusate sodium  100 mg Oral BID  . ferrous sulfate  325 mg Oral TID WC  . hydrochlorothiazide  25 mg Oral Daily  . insulin aspart  0-15 Units Subcutaneous TID WC  . insulin aspart  0-5 Units Subcutaneous QHS  . lisinopril  20 mg Oral Daily  . metFORMIN  500 mg Oral QPC supper  . pantoprazole  40 mg Oral Daily  . Rivaroxaban  15 mg Oral BID WC  . simvastatin  10 mg Oral q1800   Continuous Infusions: . sodium chloride 75 mL/hr at 02/10/14 0808   PRN Meds:.acetaminophen, acetaminophen, alum & mag hydroxide-simeth, diazepam, diphenhydrAMINE, HYDROmorphone (DILAUDID)  injection, menthol-cetylpyridinium, methocarbamol (ROBAXIN) IV, methocarbamol, metoCLOPramide (REGLAN) injection, metoCLOPramide, ondansetron (ZOFRAN) IV, ondansetron, oxyCODONE, phenol, polyethylene glycol, zolpidem  DVT Prophylaxis  xaralto  Lab Results  Component Value Date   PLT 207 02/12/2014    Antibiotics    Anti-infectives   Start     Dose/Rate Route Frequency Ordered Stop   02/09/14 1330  ceFAZolin (ANCEF) IVPB 1 g/50 mL premix     1 g 100 mL/hr over 30 Minutes Intravenous Every 6 hours 02/09/14 1130 02/09/14 1923   02/09/14 0600  ceFAZolin (ANCEF) IVPB 2 g/50 mL premix     2 g 100 mL/hr over 30 Minutes Intravenous On call to O.R. 02/08/14 1750 02/09/14 0730          Objective:   Filed Vitals:   02/12/14 1321 02/12/14 1600 02/12/14 2105 02/13/14 0900  BP: 114/59  132/59 110/65  Pulse: 88  92 92 (9am)  Temp: 98.1 F (36.7 C)  98.1 F (36.7 C)    TempSrc:   Oral    Resp:  18 18 15   Height:      Weight:      SpO2: 99%  97% 96% RA    Wt Readings from Last 3 Encounters:  02/09/14 88.905 kg (196 lb)  02/09/14 88.905 kg (196 lb)  01/29/14 89.086 kg (196 lb 6.4 oz)     Intake/Output Summary (Last 24 hours) at 02/13/14 0858 Last data filed at 02/13/14 0800  Gross per 24 hour  Intake    480 ml  Output      0 ml  Net    480 ml     Physical Exam  Awake Alert, Oriented X 3, No new F.N deficits, Normal affect [place].AT,PERRAL Supple Neck,No JVD, No cervical lymphadenopathy appriciated.  Symmetrical Chest wall movement, Good air movement bilaterally, CTAB RRR,No Gallops,Rubs or new Murmurs, No Parasternal Heave +ve B.Sounds, Abd Soft, No tenderness, No organomegaly appriciated, No rebound - guarding or rigidity. No Cyanosis, Clubbing or edema, No new Rash or bruise < L leg mildly swollen   Data Review   Micro Results No results found for this or any previous visit (from the past 240 hour(s)).  Radiology Reports Dg Knee Left Port  02/09/2014   CLINICAL  DATA:  Postop left knee  EXAM: PORTABLE LEFT KNEE - 1-2 VIEW  COMPARISON:  None.  FINDINGS: The left knee demonstrates a total knee arthroplasty without evidence of hardware failure complication. There is no significant joint effusion. There is no fracture or dislocation. The alignment is anatomic. Surgical drains are present. Post-surgical changes noted in the surrounding soft tissues.  IMPRESSION: Status post left total knee arthroplasty.   Electronically Signed   By: Kathreen Devoid   On: 02/09/2014 10:10    CBC  Recent Labs Lab 02/10/14 0428 02/11/14 0355 02/12/14 0440  WBC 9.7 12.3* 7.0  HGB 9.6* 9.5* 8.4*  HCT 30.0* 30.4* 26.0*  PLT 215 269 207  MCV 89.3 89.9 88.4  MCH 28.6 28.1 28.6  MCHC 32.0 31.3 32.3  RDW 13.9 14.6 14.6    Chemistries   Recent Labs Lab 02/10/14 0428  NA 141  K 4.4  CL 102  CO2 28  GLUCOSE 135*  BUN 12  CREATININE 0.66  CALCIUM 9.0   ------------------------------------------------------------------------------------------------------------------ estimated creatinine clearance is 76.5 ml/min (by C-G formula based on Cr of 0.66). ------------------------------------------------------------------------------------------------------------------ No results found for this basename: HGBA1C,  in the last 72 hours ------------------------------------------------------------------------------------------------------------------ No results found for this basename: CHOL, HDL, LDLCALC, TRIG, CHOLHDL, LDLDIRECT,  in the last 72 hours ------------------------------------------------------------------------------------------------------------------ No results found for this basename: TSH, T4TOTAL, FREET3, T3FREE, THYROIDAB,  in the last 72 hours ------------------------------------------------------------------------------------------------------------------ No results found for this basename: VITAMINB12, FOLATE, FERRITIN, TIBC, IRON, RETICCTPCT,  in the last 72  hours  Coagulation profile No results found for this basename: INR, PROTIME,  in the last 168 hours  No results found for this basename: DDIMER,  in the last 72 hours  Cardiac Enzymes No results found for this basename: CK, CKMB, TROPONINI, MYOGLOBIN,  in the last 168 hours ------------------------------------------------------------------------------------------------------------------ No components found with this basename: POCBNP,      Time Spent in minutes   35   Beren Yniguez K M.D on 02/13/2014 at 8:58 AM  Between 7am to 7pm - Pager - [phone]  After 7pm go to [email or web address] - password [email or web address]  And look for the night coverage person covering for me after hours  [facility] Office  [phone]   **Disclaimer: This note may have been dictated with voice recognition software. Similar sounding words can inadvertently be transcribed and this note may contain transcription errors which may not have been corrected upon publication of note.**

## 2014-02-13 NOTE — Discharge Summary (Signed)
Patient ID: [name] MRN: [id number] DOB/AGE: 61/24/1952 61 y.o.  Admit date: 02/09/2014 Discharge date: 02/13/2014  Admission Diagnoses:  Principal Problem:   Arthritis of knee, left Active Problems:   Status post total knee replacement   DVT (deep venous thrombosis)   HTN (hypertension)   GERD (gastroesophageal reflux disease)   Discharge Diagnoses:  Same  Past Medical History  Diagnosis Date  . Asthma   . Hypertension   . Diabetes mellitus without complication   . Elevated cholesterol   . GERD (gastroesophageal reflux disease)   . Arthritis   . Fibromyalgia   . Cancer   . Rectal cancer 2003    Surgeries: Procedure(s): LEFT TOTAL KNEE ARTHROPLASTY on 02/09/2014   Consultants:    Discharged Condition: Improved  Hospital Course: [name] is an 63 y.o. female who was admitted 02/09/2014 for operative treatment ofArthritis of knee, left. Patient has severe unremitting pain that affects sleep, daily activities, and work/hobbies. After pre-op clearance the patient was taken to the operating room on 02/09/2014 and underwent  Procedure(s): LEFT TOTAL KNEE ARTHROPLASTY.    Patient was given perioperative antibiotics: Anti-infectives   Start     Dose/Rate Route Frequency Ordered Stop   02/09/14 1330  ceFAZolin (ANCEF) IVPB 1 g/50 mL premix     1 g 100 mL/hr over 30 Minutes Intravenous Every 6 hours 02/09/14 1130 02/09/14 1923   02/09/14 0600  ceFAZolin (ANCEF) IVPB 2 g/50 mL premix     2 g 100 mL/hr over 30 Minutes Intravenous On call to O.R. 02/08/14 1750 02/09/14 0730       Patient was given sequential compression devices, early ambulation, and chemoprophylaxis to prevent DVT.  However, after reporting left calf pain on POD#3, a doppler study revealed a DVT and she was started on Xarelto.    Recent vital signs: Patient Vitals for the past 24 hrs:  BP Temp Temp src Pulse Resp SpO2  02/13/14 0522 104/64 mmHg 98.4 F (36.9 C) Oral 100 16 93 %  02/12/14 2105 132/59 mmHg  98.1 F (36.7 C) Oral 92 18 97 %  02/12/14 1600 - - - - 18 -  02/12/14 1321 114/59 mmHg 98.1 F (36.7 C) - 88 - 99 %  02/12/14 1148 - - - - 18 -  02/12/14 0800 - - - - 18 -     Recent laboratory studies:  Recent Labs  02/11/14 0355 02/12/14 0440  WBC 12.3* 7.0  HGB 9.5* 8.4*  HCT 30.4* 26.0*  PLT 269 207     Discharge Medications:     Medication List    STOP taking these medications       HYDROcodone-acetaminophen 5-325 MG per tablet  Commonly known as:  NORCO/VICODIN      TAKE these medications       ferrous sulfate 325 (65 FE) MG tablet  Take 1 tablet (325 mg total) by mouth 3 (three) times daily with meals.     lisinopril-hydrochlorothiazide 20-25 MG per tablet  Commonly known as:  PRINZIDE,ZESTORETIC  Take 1 tablet by mouth daily.     metFORMIN 500 MG tablet  Commonly known as:  GLUCOPHAGE  Take 500 mg by mouth daily after supper.     oxyCODONE-acetaminophen 5-325 MG per tablet  Commonly known as:  ROXICET  Take 1-2 tablets by mouth every 4 (four) hours as needed for severe pain.     pravastatin 20 MG tablet  Commonly known as:  PRAVACHOL  Take 20 mg by mouth at bedtime.  ranitidine 300 MG tablet  Commonly known as:  ZANTAC  Take 300 mg by mouth daily as needed for heartburn.     Rivaroxaban 15 MG Tabs tablet  Commonly known as:  XARELTO  Take 1 tablet (15 mg total) by mouth 2 (two) times daily with a meal.        Diagnostic Studies: Dg Knee Left Port  02/09/2014   CLINICAL DATA:  Postop left knee  EXAM: PORTABLE LEFT KNEE - 1-2 VIEW  COMPARISON:  None.  FINDINGS: The left knee demonstrates a total knee arthroplasty without evidence of hardware failure complication. There is no significant joint effusion. There is no fracture or dislocation. The alignment is anatomic. Surgical drains are present. Post-surgical changes noted in the surrounding soft tissues.  IMPRESSION: Status post left total knee arthroplasty.   Electronically Signed   By: Kathreen Devoid   On: 02/09/2014 10:10    Disposition: To skilled nursing facility      Discharge Instructions   Call MD / Call 911    Complete by:  As directed   If you experience chest pain or shortness of breath, CALL 911 and be transported to the hospital emergency room.  If you develope a fever above 101 F, pus (white drainage) or increased drainage or redness at the wound, or calf pain, call your surgeon's office.     Constipation Prevention    Complete by:  As directed   Drink plenty of fluids.  Prune juice may be helpful.  You may use a stool softener, such as Colace (over the counter) 100 mg twice a day.  Use MiraLax (over the counter) for constipation as needed.     Diet - low sodium heart healthy    Complete by:  As directed      Discharge instructions    Complete by:  As directed   Full weight bearing as tolerated left knee and can work on knee motion. New dry dressing daily right knee. Can get incision wet in the shower daily starting 02/15/14.     Discharge patient    Complete by:  As directed      Increase activity slowly as tolerated    Complete by:  As directed               Signed: Mcarthur Rossetti 02/13/2014, 7:24 AM

## 2014-02-13 NOTE — Progress Notes (Signed)
Subjective: 4 Days Post-Op Procedure(s) (LRB): LEFT TOTAL KNEE ARTHROPLASTY (Left) Patient reports pain as moderate.  Now on Xarelto for DVT.  Understands that she will be discharged today.  Objective: Vital signs in last 24 hours: Temp:  [98.1 F (36.7 C)-98.4 F (36.9 C)] 98.4 F (36.9 C) (07/07 0522) Pulse Rate:  [88-100] 100 (07/07 0522) Resp:  [16-18] 16 (07/07 0522) BP: (104-132)/(59-64) 104/64 mmHg (07/07 0522) SpO2:  [93 %-99 %] 93 % (07/07 0522)  Intake/Output from previous day: 07/06 0701 - 07/07 0700 In: 480 [P.O.:480] Out: -  Intake/Output this shift:     Recent Labs  02/11/14 0355 02/12/14 0440  HGB 9.5* 8.4*    Recent Labs  02/11/14 0355 02/12/14 0440  WBC 12.3* 7.0  RBC 3.38* 2.94*  HCT 30.4* 26.0*  PLT 269 207   No results found for this basename: NA, K, CL, CO2, BUN, CREATININE, GLUCOSE, CALCIUM,  in the last 72 hours No results found for this basename: LABPT, INR,  in the last 72 hours  Sensation intact distally Intact pulses distally Dorsiflexion/Plantar flexion intact Incision: scant drainage No cellulitis present Compartment soft  Assessment/Plan: 4 Days Post-Op Procedure(s) (LRB): LEFT TOTAL KNEE ARTHROPLASTY (Left) Discharge to SNF today Xarelto 15 mg po bid  Trebor Galdamez Y 02/13/2014, 7:06 AM

## 2014-04-02 ENCOUNTER — Ambulatory Visit: Payer: Medicare Other | Attending: Family Medicine | Admitting: Physical Therapy

## 2014-04-02 DIAGNOSIS — M2550 Pain in unspecified joint: Secondary | ICD-10-CM | POA: Diagnosis not present

## 2014-04-02 DIAGNOSIS — M25669 Stiffness of unspecified knee, not elsewhere classified: Secondary | ICD-10-CM | POA: Diagnosis not present

## 2014-04-02 DIAGNOSIS — IMO0001 Reserved for inherently not codable concepts without codable children: Secondary | ICD-10-CM | POA: Diagnosis present

## 2014-04-02 DIAGNOSIS — R262 Difficulty in walking, not elsewhere classified: Secondary | ICD-10-CM | POA: Insufficient documentation

## 2014-04-03 ENCOUNTER — Ambulatory Visit: Payer: Medicare Other | Admitting: Physical Therapy

## 2014-04-03 DIAGNOSIS — IMO0001 Reserved for inherently not codable concepts without codable children: Secondary | ICD-10-CM | POA: Diagnosis not present

## 2014-04-09 ENCOUNTER — Ambulatory Visit: Payer: Medicare Other | Admitting: Physical Therapy

## 2014-04-09 DIAGNOSIS — IMO0001 Reserved for inherently not codable concepts without codable children: Secondary | ICD-10-CM | POA: Diagnosis not present

## 2014-04-11 ENCOUNTER — Ambulatory Visit: Payer: Medicare Other | Attending: Family Medicine | Admitting: Physical Therapy

## 2014-04-11 DIAGNOSIS — R262 Difficulty in walking, not elsewhere classified: Secondary | ICD-10-CM | POA: Diagnosis not present

## 2014-04-11 DIAGNOSIS — M25569 Pain in unspecified knee: Secondary | ICD-10-CM | POA: Diagnosis not present

## 2014-04-11 DIAGNOSIS — E119 Type 2 diabetes mellitus without complications: Secondary | ICD-10-CM | POA: Diagnosis not present

## 2014-04-11 DIAGNOSIS — I10 Essential (primary) hypertension: Secondary | ICD-10-CM | POA: Insufficient documentation

## 2014-04-11 DIAGNOSIS — IMO0001 Reserved for inherently not codable concepts without codable children: Secondary | ICD-10-CM | POA: Diagnosis present

## 2014-04-11 DIAGNOSIS — M25669 Stiffness of unspecified knee, not elsewhere classified: Secondary | ICD-10-CM | POA: Insufficient documentation

## 2014-04-11 DIAGNOSIS — M5430 Sciatica, unspecified side: Secondary | ICD-10-CM | POA: Diagnosis not present

## 2014-04-11 DIAGNOSIS — M1710 Unilateral primary osteoarthritis, unspecified knee: Secondary | ICD-10-CM | POA: Diagnosis not present

## 2014-04-17 ENCOUNTER — Ambulatory Visit: Payer: Medicare Other | Admitting: Physical Therapy

## 2014-04-17 DIAGNOSIS — IMO0001 Reserved for inherently not codable concepts without codable children: Secondary | ICD-10-CM | POA: Diagnosis not present

## 2014-04-18 ENCOUNTER — Ambulatory Visit: Payer: Medicare Other | Admitting: Rehabilitation

## 2014-04-18 ENCOUNTER — Other Ambulatory Visit (HOSPITAL_COMMUNITY): Payer: Self-pay | Admitting: Orthopaedic Surgery

## 2014-04-18 DIAGNOSIS — IMO0001 Reserved for inherently not codable concepts without codable children: Secondary | ICD-10-CM | POA: Diagnosis not present

## 2014-04-18 DIAGNOSIS — M7989 Other specified soft tissue disorders: Secondary | ICD-10-CM

## 2014-04-18 DIAGNOSIS — M25562 Pain in left knee: Secondary | ICD-10-CM

## 2014-04-19 ENCOUNTER — Ambulatory Visit (HOSPITAL_COMMUNITY)
Admission: RE | Admit: 2014-04-19 | Discharge: 2014-04-19 | Disposition: A | Discharge status: Home / Self Care | Payer: Medicare Other | Source: Ambulatory Visit | Attending: Orthopaedic Surgery | Admitting: Orthopaedic Surgery

## 2014-04-19 DIAGNOSIS — Z96659 Presence of unspecified artificial knee joint: Secondary | ICD-10-CM | POA: Insufficient documentation

## 2014-04-19 DIAGNOSIS — M25569 Pain in unspecified knee: Secondary | ICD-10-CM | POA: Diagnosis not present

## 2014-04-19 DIAGNOSIS — M7989 Other specified soft tissue disorders: Secondary | ICD-10-CM | POA: Diagnosis present

## 2014-04-19 DIAGNOSIS — IMO0002 Reserved for concepts with insufficient information to code with codable children: Secondary | ICD-10-CM

## 2014-04-19 DIAGNOSIS — I803 Phlebitis and thrombophlebitis of lower extremities, unspecified: Secondary | ICD-10-CM

## 2014-04-19 DIAGNOSIS — M1712 Unilateral primary osteoarthritis, left knee: Secondary | ICD-10-CM

## 2014-04-19 DIAGNOSIS — M1710 Unilateral primary osteoarthritis, unspecified knee: Secondary | ICD-10-CM | POA: Diagnosis not present

## 2014-04-19 DIAGNOSIS — Z86718 Personal history of other venous thrombosis and embolism: Secondary | ICD-10-CM | POA: Insufficient documentation

## 2014-04-19 DIAGNOSIS — M25562 Pain in left knee: Secondary | ICD-10-CM

## 2014-04-19 NOTE — Progress Notes (Signed)
VASCULAR LAB PRELIMINARY  PRELIMINARY  PRELIMINARY  PRELIMINARY  Left lower extremity venous duplex completed.    Preliminary report:  Left:  No evidence of DVT, superficial thrombosis, or Baker's cyst.  Previous DVT in the PTV and peroneal vein appear resolved.  Kolby Myung, RVT 04/19/2014, 9:11 AM

## 2014-04-25 ENCOUNTER — Ambulatory Visit: Payer: Medicare Other | Admitting: Rehabilitation

## 2014-04-25 DIAGNOSIS — IMO0001 Reserved for inherently not codable concepts without codable children: Secondary | ICD-10-CM | POA: Diagnosis not present

## 2014-05-02 ENCOUNTER — Ambulatory Visit: Payer: Medicare Other | Admitting: Physical Therapy

## 2014-05-02 DIAGNOSIS — IMO0001 Reserved for inherently not codable concepts without codable children: Secondary | ICD-10-CM | POA: Diagnosis not present

## 2014-05-04 ENCOUNTER — Ambulatory Visit: Payer: Medicare Other | Admitting: Physical Therapy

## 2014-05-04 DIAGNOSIS — IMO0001 Reserved for inherently not codable concepts without codable children: Secondary | ICD-10-CM | POA: Diagnosis not present

## 2014-05-07 ENCOUNTER — Encounter: Payer: Medicare Other | Admitting: Physical Therapy

## 2014-05-09 ENCOUNTER — Encounter: Payer: Medicare Other | Admitting: Physical Therapy

## 2014-06-11 ENCOUNTER — Encounter (HOSPITAL_COMMUNITY): Payer: Self-pay | Admitting: Orthopaedic Surgery

## 2015-10-14 IMAGING — CR DG KNEE 1-2V PORT*L*
2 series · 2 of 2 positions shown · non-contrast
Comparison: None.

CLINICAL DATA: Postop left knee

EXAM:
PORTABLE LEFT KNEE - 1-2 VIEW

[AP]
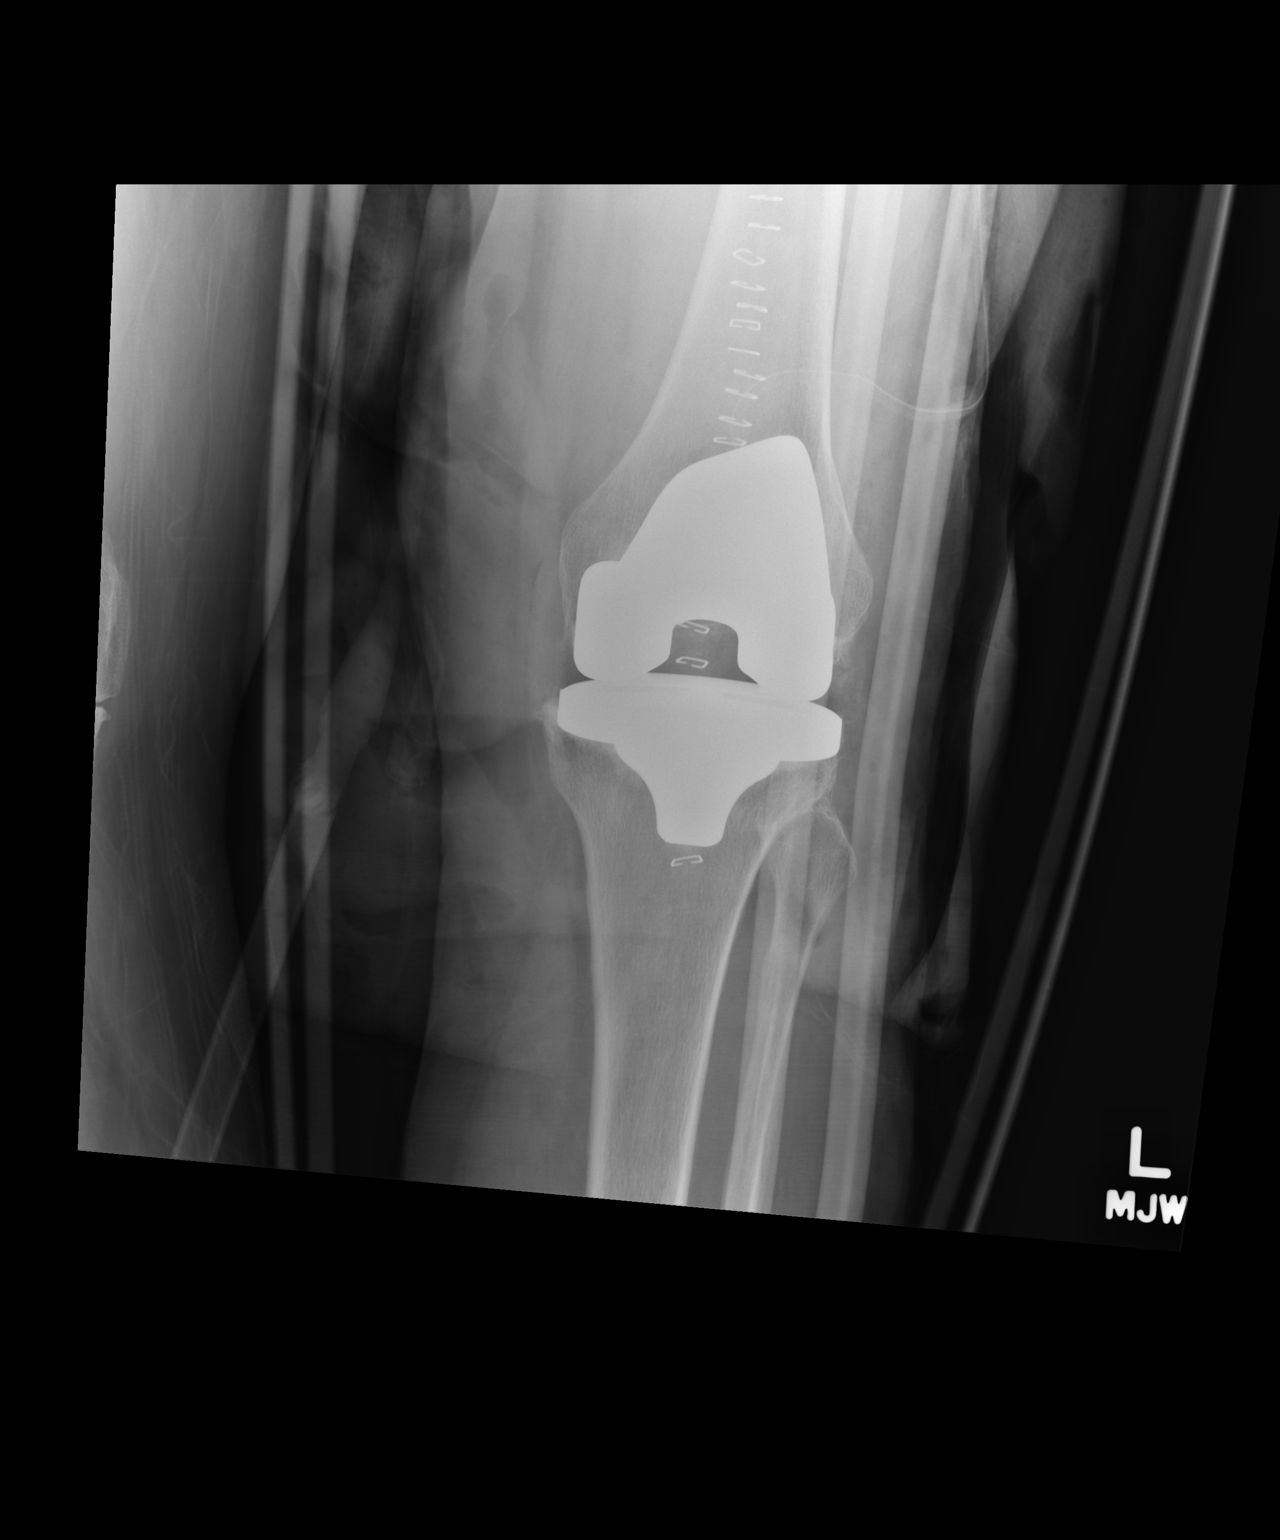

[xtable lateral]
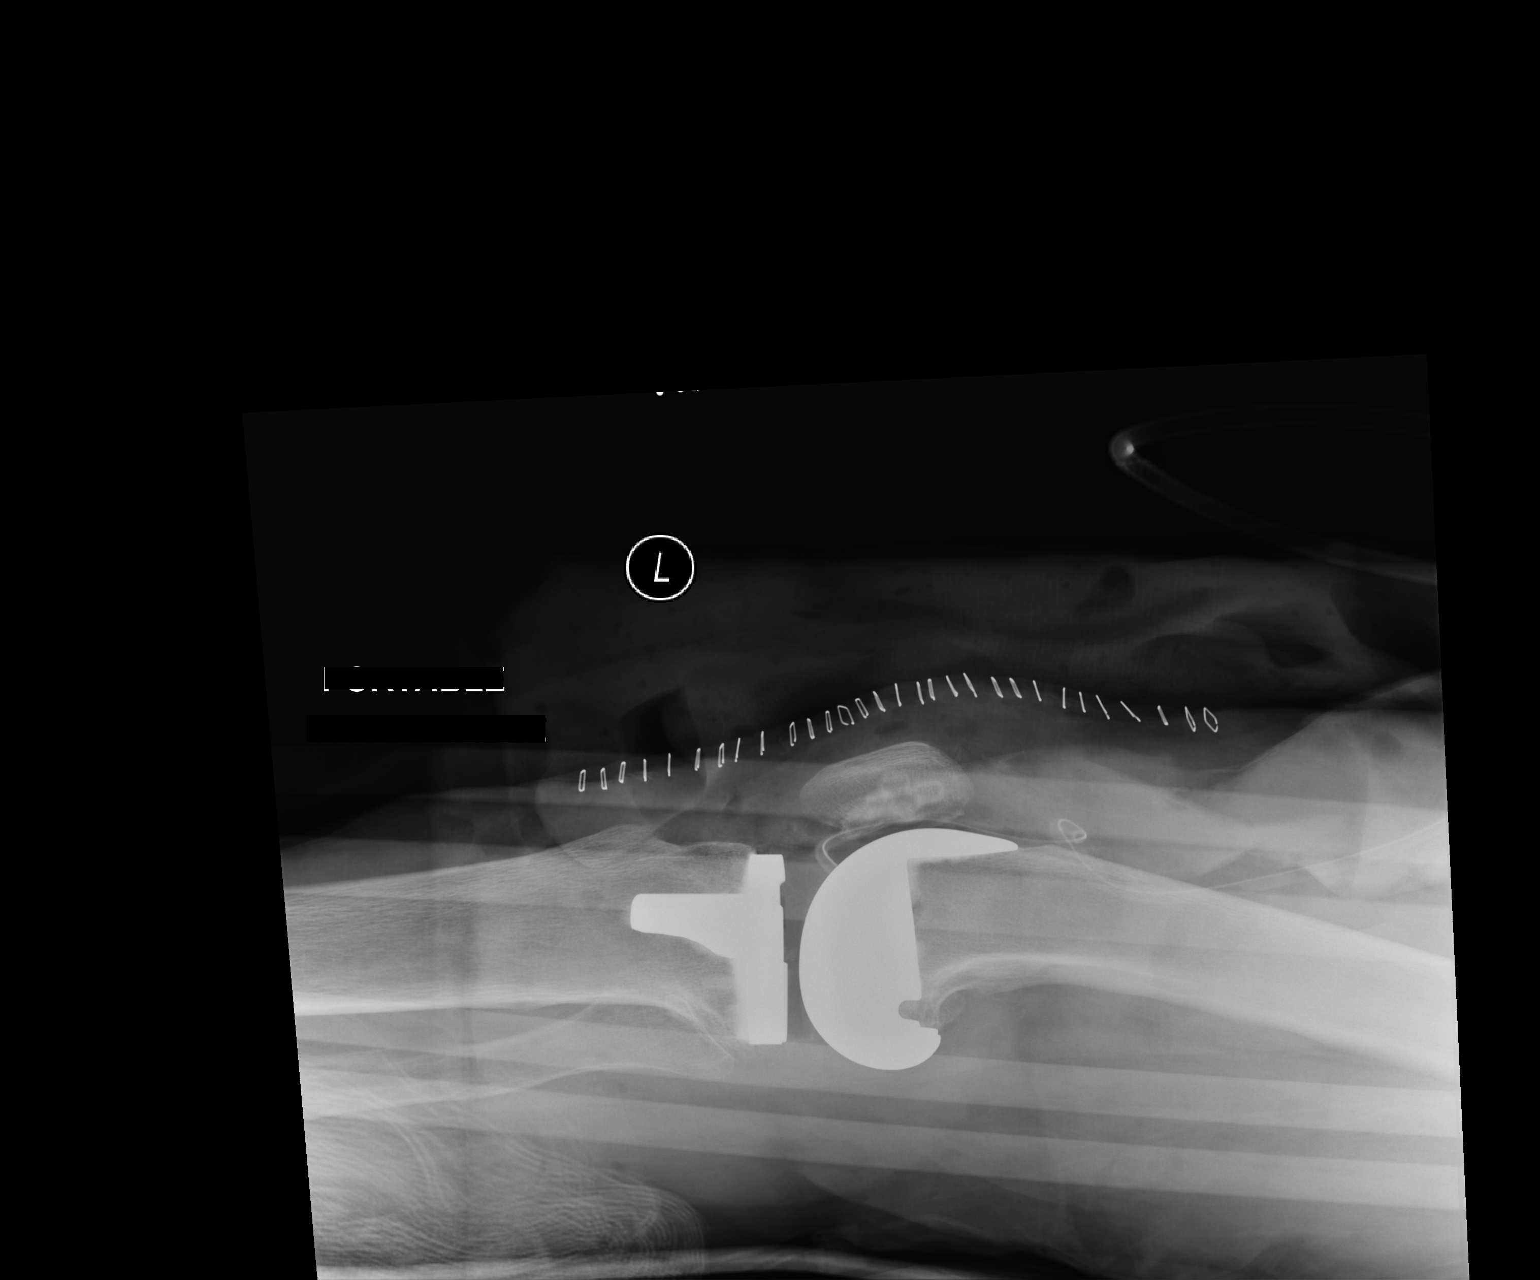

[2 of 2 positions shown; findings below may reference images not displayed]

FINDINGS: The left knee demonstrates a total knee arthroplasty without
evidence of hardware failure complication. There is no significant
joint effusion. There is no fracture or dislocation. The alignment
is anatomic. Surgical drains are present. Post-surgical changes
noted in the surrounding soft tissues.
IMPRESSION: Status post left total knee arthroplasty.

## 2018-05-18 ENCOUNTER — Telehealth (INDEPENDENT_AMBULATORY_CARE_PROVIDER_SITE_OTHER): Payer: Self-pay | Admitting: Orthopaedic Surgery

## 2018-05-18 NOTE — Telephone Encounter (Signed)
Patient called in need of patient joint ID card for her left knee replacement. Patient currently lives in [place] will need it mailed to her. Please call patient to discuss patient care she has questions about her surgery knee.

## 2018-05-18 NOTE — Telephone Encounter (Signed)
Patient aware I have sent this in the mail to her
